# Patient Record
Sex: Male | Born: 1970
Health system: Southern US, Community
[De-identification: ages and names within clinical notes are randomized; demographics above are authoritative.]

## PROBLEM LIST (undated history)

## (undated) DIAGNOSIS — K604 Rectal fistula, unspecified: Secondary | ICD-10-CM

## (undated) DIAGNOSIS — G709 Myoneural disorder, unspecified: Secondary | ICD-10-CM

## (undated) DIAGNOSIS — E785 Hyperlipidemia, unspecified: Secondary | ICD-10-CM

## (undated) DIAGNOSIS — K621 Rectal polyp: Secondary | ICD-10-CM

## (undated) DIAGNOSIS — Z789 Other specified health status: Secondary | ICD-10-CM

## (undated) DIAGNOSIS — I10 Essential (primary) hypertension: Secondary | ICD-10-CM

## (undated) DIAGNOSIS — E119 Type 2 diabetes mellitus without complications: Secondary | ICD-10-CM

## (undated) HISTORY — DX: Rectal polyp: K62.1

## (undated) HISTORY — DX: Rectal fistula, unspecified: K60.40

## (undated) HISTORY — PX: HAND SURGERY: SHX662

## (undated) HISTORY — PX: WISDOM TOOTH EXTRACTION: SHX21

## (undated) HISTORY — DX: Rectal fistula: K60.4

## (undated) HISTORY — DX: Myoneural disorder, unspecified: G70.9

## (undated) HISTORY — DX: Essential (primary) hypertension: I10

## (undated) HISTORY — DX: Hyperlipidemia, unspecified: E78.5

---

## 2001-12-30 ENCOUNTER — Emergency Department (HOSPITAL_COMMUNITY): Admission: EM | Admit: 2001-12-30 | Discharge: 2001-12-30 | Payer: Self-pay | Admitting: Emergency Medicine

## 2010-01-12 ENCOUNTER — Emergency Department (HOSPITAL_COMMUNITY): Admission: EM | Admit: 2010-01-12 | Discharge: 2010-01-12 | Payer: Self-pay | Admitting: Emergency Medicine

## 2015-07-01 ENCOUNTER — Other Ambulatory Visit: Payer: Self-pay | Admitting: Orthopedic Surgery

## 2015-07-03 ENCOUNTER — Ambulatory Visit (HOSPITAL_COMMUNITY)
Admission: RE | Admit: 2015-07-03 | Discharge: 2015-07-03 | Disposition: A | Discharge status: Home / Self Care | Payer: Worker's Compensation | Source: Ambulatory Visit | Attending: Orthopedic Surgery | Admitting: Orthopedic Surgery

## 2015-07-03 ENCOUNTER — Encounter (HOSPITAL_COMMUNITY): Payer: Self-pay

## 2015-07-03 ENCOUNTER — Encounter (HOSPITAL_COMMUNITY)
Admission: RE | Admit: 2015-07-03 | Discharge: 2015-07-03 | Disposition: A | Discharge status: Home / Self Care | Payer: Worker's Compensation | Source: Ambulatory Visit | Attending: Orthopedic Surgery | Admitting: Orthopedic Surgery

## 2015-07-03 DIAGNOSIS — Z01812 Encounter for preprocedural laboratory examination: Secondary | ICD-10-CM | POA: Insufficient documentation

## 2015-07-03 DIAGNOSIS — Z01818 Encounter for other preprocedural examination: Secondary | ICD-10-CM | POA: Diagnosis not present

## 2015-07-03 DIAGNOSIS — M5410 Radiculopathy, site unspecified: Secondary | ICD-10-CM | POA: Diagnosis not present

## 2015-07-03 HISTORY — DX: Other specified health status: Z78.9

## 2015-07-03 LAB — COMPREHENSIVE METABOLIC PANEL
ALT: 85 U/L — ABNORMAL HIGH (ref 17–63)
AST: 46 U/L — ABNORMAL HIGH (ref 15–41)
Albumin: 4.2 g/dL (ref 3.5–5.0)
Alkaline Phosphatase: 83 U/L (ref 38–126)
Anion gap: 11 (ref 5–15)
BUN: 11 mg/dL (ref 6–20)
CO2: 24 mmol/L (ref 22–32)
Calcium: 9.8 mg/dL (ref 8.9–10.3)
Chloride: 104 mmol/L (ref 101–111)
Creatinine, Ser: 0.93 mg/dL (ref 0.61–1.24)
GFR calc Af Amer: >60 mL/min (ref >60)
GFR calc non Af Amer: >60 mL/min (ref >60)
Glucose, Bld: 167 mg/dL — ABNORMAL HIGH (ref 65–99)
Potassium: 4.3 mmol/L (ref 3.5–5.1)
Sodium: 139 mmol/L (ref 135–145)
Total Bilirubin: 0.9 mg/dL (ref 0.3–1.2)
Total Protein: 7.2 g/dL (ref 6.5–8.1)

## 2015-07-03 LAB — SURGICAL PCR SCREEN
MRSA, PCR: NEGATIVE
Staphylococcus aureus: POSITIVE — AB

## 2015-07-03 LAB — CBC WITH DIFFERENTIAL/PLATELET
Basophils Absolute: 0 10*3/uL (ref 0.0–0.1)
Basophils Relative: 0 %
Eosinophils Absolute: 0.1 10*3/uL (ref 0.0–0.7)
Eosinophils Relative: 1 %
HCT: 44.6 % (ref 39.0–52.0)
Hemoglobin: 15.2 g/dL (ref 13.0–17.0)
Lymphocytes Relative: 25 %
Lymphs Abs: 2 10*3/uL (ref 0.7–4.0)
MCH: 28.7 pg (ref 26.0–34.0)
MCHC: 34.1 g/dL (ref 30.0–36.0)
MCV: 84.2 fL (ref 78.0–100.0)
Monocytes Absolute: 0.5 10*3/uL (ref 0.1–1.0)
Monocytes Relative: 6 %
Neutro Abs: 5.5 10*3/uL (ref 1.7–7.7)
Neutrophils Relative %: 68 %
Platelets: 301 10*3/uL (ref 150–400)
RBC: 5.3 MIL/uL (ref 4.22–5.81)
RDW: 13.3 % (ref 11.5–15.5)
WBC: 8.1 10*3/uL (ref 4.0–10.5)

## 2015-07-03 LAB — APTT: aPTT: 26 seconds (ref 24–37)

## 2015-07-03 LAB — URINALYSIS, ROUTINE W REFLEX MICROSCOPIC
Bilirubin Urine: NEGATIVE
Glucose, UA: NEGATIVE mg/dL
Hgb urine dipstick: NEGATIVE
Ketones, ur: NEGATIVE mg/dL
Leukocytes, UA: NEGATIVE
Nitrite: NEGATIVE
Protein, ur: 30 mg/dL — AB
Specific Gravity, Urine: 1.021 (ref 1.005–1.030)
pH: 5.5 (ref 5.0–8.0)

## 2015-07-03 LAB — URINE MICROSCOPIC-ADD ON
Bacteria, UA: NONE SEEN
RBC / HPF: NONE SEEN RBC/hpf (ref 0–5)
Squamous Epithelial / LPF: NONE SEEN
WBC, UA: NONE SEEN WBC/hpf (ref 0–5)

## 2015-07-03 LAB — PROTIME-INR
INR: 0.99 (ref 0.00–1.49)
Prothrombin Time: 13.3 seconds (ref 11.6–15.2)

## 2015-07-03 NOTE — Progress Notes (Signed)
PCP - patient denies having a PCP and states that if he needed to see a doctor he would go to Urgent Care in [place] - denies  EKG- 07/03/15 CXR - 07/03/15  Echo/stress test/cardiac cath - denies  Patient denies chest pain and shortness of breath at PAT appointment.

## 2015-07-03 NOTE — Pre-Procedure Instructions (Signed)
    TIMBERLAND ZEHRUNG  07/03/2015      [id number] - [place], [place] [address] PhoneSE:[phone] FaxXO:[phone]    Your procedure is scheduled on Wednesday, February 22nd, 2017.  Report to [facility] Admitting at 9:30 A.M.  Call this number if you have problems the morning of surgery:  [phone]   Remember:  Do not eat food or drink liquids after midnight.   Take these medicines the morning of surgery with A SIP OF WATER: Acetaminophen-Codeine (Tylenol #3).  Stop taking: Meloxicam (Mobic), Aspirin, NSAIDS, Aleve, Naproxen, Ibuprofen, Advil, Motrin, BC's, Goody's, fish oil, all herbal medications, and all vitamins.    Do not wear jewelry.  Do not wear lotions, powders, or colognes.    Men may shave face and neck.  Do not bring valuables to the hospital.  [facility] is not responsible for any belongings or valuables.  Contacts, dentures or bridgework may not be worn into surgery.  Leave your suitcase in the car.  After surgery it may be brought to your room.  For patients admitted to the hospital, discharge time will be determined by your treatment team.  Patients discharged the day of surgery will not be allowed to drive home.   Special instructions:  See attached.   Please read over the following fact sheets that you were given. Pain Booklet, Coughing and Deep Breathing, MRSA Information and Surgical Site Infection Prevention

## 2015-07-03 NOTE — Progress Notes (Signed)
Left message for patient informing him of PCR results.  Prescription called to [facility].

## 2015-07-05 NOTE — H&P (Signed)
     PREOPERATIVE H&P  Chief Complaint: R leg pain  HPI: Jason Waters is a 45 y.o. male who presents with ongoing pain in the R leg  MRI reveals a broad based L5/S1 HNP, compressing the R S1 nerve  Patient has failed multiple forms of conservative care and continues to have pain (see office notes for additional details regarding the patient's full course of treatment)  Past Medical History  Diagnosis Date  . Medical history non-contributory    Past Surgical History  Procedure Laterality Date  . Hand surgery Left   . Wisdom tooth extraction     Social History   Social History  . Marital Status: Divorced    Spouse Name: N/A  . Number of Children: N/A  . Years of Education: N/A   Social History Main Topics  . Smoking status: Never Smoker   . Smokeless tobacco: Never Used  . Alcohol Use: Yes     Comment: rarely  . Drug Use: No  . Sexual Activity: Not on file   Other Topics Concern  . Not on file   Social History Narrative  . No narrative on file   No family history on file. No Known Allergies Prior to Admission medications   Medication Sig Start Date End Date Taking? Authorizing Provider  acetaminophen-codeine (TYLENOL #3) 300-30 MG tablet Take 1-2 tablets by mouth every 4 (four) hours as needed for moderate pain.   Yes Historical Provider, MD  meloxicam (MOBIC) 15 MG tablet Take 15 mg by mouth daily.   Yes Historical Provider, MD     All other systems have been reviewed and were otherwise negative with the exception of those mentioned in the HPI and as above.  Physical Exam: There were no vitals filed for this visit.  General: Alert, no acute distress Cardiovascular: No pedal edema Respiratory: No cyanosis, no use of accessory musculature Skin: No lesions in the area of chief complaint Neurologic: Sensation intact distally Psychiatric: Patient is competent for consent with normal mood and affect Lymphatic: No axillary or cervical  lymphadenopathy  MUSCULOSKELETAL: + SLR on the right  Assessment/Plan: Radiculopathy Plan for Procedure(s): RIGHT SIDED LUMBAR 5-SACRUM 1 MICRODISECTOMY   Sinclair Ship, MD 07/05/2015 8:35 AM

## 2015-07-08 MED ORDER — DEXTROSE 5 % IV SOLN
3.0000 g | INTRAVENOUS | Status: AC
  Administered 2015-07-09: 3 g via INTRAVENOUS
  Filled 2015-07-08: qty 3000

## 2015-07-09 ENCOUNTER — Ambulatory Visit (HOSPITAL_COMMUNITY): Payer: Worker's Compensation | Admitting: Certified Registered"

## 2015-07-09 ENCOUNTER — Ambulatory Visit (HOSPITAL_COMMUNITY): Payer: Worker's Compensation

## 2015-07-09 ENCOUNTER — Ambulatory Visit (HOSPITAL_COMMUNITY)
Admission: RE | Admit: 2015-07-09 | Discharge: 2015-07-09 | Disposition: A | Discharge status: Home / Self Care | Payer: Worker's Compensation | Source: Ambulatory Visit | Attending: Orthopedic Surgery | Admitting: Orthopedic Surgery

## 2015-07-09 ENCOUNTER — Encounter (HOSPITAL_COMMUNITY)
Admission: RE | Disposition: A | Discharge status: Home / Self Care | Payer: Self-pay | Source: Ambulatory Visit | Attending: Orthopedic Surgery

## 2015-07-09 ENCOUNTER — Encounter (HOSPITAL_COMMUNITY): Payer: Self-pay | Admitting: *Deleted

## 2015-07-09 DIAGNOSIS — Z791 Long term (current) use of non-steroidal anti-inflammatories (NSAID): Secondary | ICD-10-CM | POA: Insufficient documentation

## 2015-07-09 DIAGNOSIS — Z6841 Body Mass Index (BMI) 40.0 and over, adult: Secondary | ICD-10-CM | POA: Diagnosis not present

## 2015-07-09 DIAGNOSIS — M5117 Intervertebral disc disorders with radiculopathy, lumbosacral region: Secondary | ICD-10-CM | POA: Insufficient documentation

## 2015-07-09 DIAGNOSIS — M5418 Radiculopathy, sacral and sacrococcygeal region: Secondary | ICD-10-CM | POA: Diagnosis present

## 2015-07-09 DIAGNOSIS — Z419 Encounter for procedure for purposes other than remedying health state, unspecified: Secondary | ICD-10-CM

## 2015-07-09 HISTORY — PX: LUMBAR LAMINECTOMY/DECOMPRESSION MICRODISCECTOMY: SHX5026

## 2015-07-09 SURGERY — LUMBAR LAMINECTOMY/DECOMPRESSION MICRODISCECTOMY
Anesthesia: General | Site: Spine Lumbar | Laterality: Right

## 2015-07-09 MED ORDER — ROCURONIUM BROMIDE 100 MG/10ML IV SOLN
INTRAVENOUS | Status: DC | PRN
  Administered 2015-07-09 (×3): 10 mg via INTRAVENOUS
  Administered 2015-07-09: 50 mg via INTRAVENOUS
  Administered 2015-07-09 (×2): 20 mg via INTRAVENOUS

## 2015-07-09 MED ORDER — METHYLPREDNISOLONE ACETATE 40 MG/ML IJ SUSP
INTRAMUSCULAR | Status: AC
  Filled 2015-07-09: qty 1

## 2015-07-09 MED ORDER — PROPOFOL 10 MG/ML IV BOLUS
INTRAVENOUS | Status: AC
  Filled 2015-07-09: qty 20

## 2015-07-09 MED ORDER — BUPIVACAINE-EPINEPHRINE 0.25% -1:200000 IJ SOLN
INTRAMUSCULAR | Status: DC | PRN
  Administered 2015-07-09: 6 mL

## 2015-07-09 MED ORDER — SUFENTANIL CITRATE 50 MCG/ML IV SOLN
INTRAVENOUS | Status: DC | PRN
  Administered 2015-07-09 (×2): 10 ug via INTRAVENOUS
  Administered 2015-07-09: 30 ug via INTRAVENOUS
  Administered 2015-07-09: 10 ug via INTRAVENOUS

## 2015-07-09 MED ORDER — METHYLENE BLUE 0.5 % INJ SOLN
INTRAVENOUS | Status: DC | PRN
Start: 2015-07-09 — End: 2015-07-09
  Administered 2015-07-09: .5 mL via INTRADERMAL

## 2015-07-09 MED ORDER — METHYLPREDNISOLONE ACETATE 40 MG/ML IJ SUSP
INTRAMUSCULAR | Status: DC | PRN
  Administered 2015-07-09: 40 mg via INTRALESIONAL

## 2015-07-09 MED ORDER — ONDANSETRON HCL 4 MG/2ML IJ SOLN
INTRAMUSCULAR | Status: DC | PRN
Start: 2015-07-09 — End: 2015-07-09
  Administered 2015-07-09: 4 mg via INTRAVENOUS

## 2015-07-09 MED ORDER — ROCURONIUM BROMIDE 50 MG/5ML IV SOLN
INTRAVENOUS | Status: AC
  Filled 2015-07-09: qty 2

## 2015-07-09 MED ORDER — PROPOFOL 10 MG/ML IV BOLUS
INTRAVENOUS | Status: DC | PRN
  Administered 2015-07-09: 200 mg via INTRAVENOUS

## 2015-07-09 MED ORDER — LIDOCAINE HCL (CARDIAC) 20 MG/ML IV SOLN
INTRAVENOUS | Status: AC
  Filled 2015-07-09: qty 5

## 2015-07-09 MED ORDER — SUFENTANIL CITRATE 50 MCG/ML IV SOLN
INTRAVENOUS | Status: AC
  Filled 2015-07-09: qty 1

## 2015-07-09 MED ORDER — THROMBIN 20000 UNITS EX SOLR
CUTANEOUS | Status: DC | PRN
  Administered 2015-07-09: 20 mL via TOPICAL

## 2015-07-09 MED ORDER — HEMOSTATIC AGENTS (NO CHARGE) OPTIME
TOPICAL | Status: DC | PRN
  Administered 2015-07-09: 1 via TOPICAL

## 2015-07-09 MED ORDER — SUCCINYLCHOLINE CHLORIDE 20 MG/ML IJ SOLN
INTRAMUSCULAR | Status: AC
  Filled 2015-07-09: qty 2

## 2015-07-09 MED ORDER — LIDOCAINE HCL (CARDIAC) 20 MG/ML IV SOLN
INTRAVENOUS | Status: DC | PRN
  Administered 2015-07-09: 30 mg via INTRAVENOUS

## 2015-07-09 MED ORDER — SUGAMMADEX SODIUM 200 MG/2ML IV SOLN
INTRAVENOUS | Status: DC | PRN
  Administered 2015-07-09: 200 mg via INTRAVENOUS

## 2015-07-09 MED ORDER — MIDAZOLAM HCL 5 MG/5ML IJ SOLN
INTRAMUSCULAR | Status: DC | PRN
  Administered 2015-07-09: 2 mg via INTRAVENOUS

## 2015-07-09 MED ORDER — OXYCODONE HCL 5 MG PO TABS: 5.0000 mg | ORAL_TABLET | Freq: Once | ORAL | Status: DC

## 2015-07-09 MED ORDER — SODIUM CHLORIDE 0.9 % IJ SOLN
INTRAMUSCULAR | Status: AC
  Filled 2015-07-09: qty 10

## 2015-07-09 MED ORDER — MIDAZOLAM HCL 2 MG/2ML IJ SOLN: 0.5000 mg | Freq: Once | INTRAMUSCULAR | Status: DC | PRN

## 2015-07-09 MED ORDER — POVIDONE-IODINE 7.5 % EX SOLN
Freq: Once | CUTANEOUS | Status: DC
  Filled 2015-07-09: qty 118

## 2015-07-09 MED ORDER — ROCURONIUM BROMIDE 50 MG/5ML IV SOLN
INTRAVENOUS | Status: AC
  Filled 2015-07-09: qty 1

## 2015-07-09 MED ORDER — BUPIVACAINE-EPINEPHRINE (PF) 0.25% -1:200000 IJ SOLN
INTRAMUSCULAR | Status: AC
  Filled 2015-07-09: qty 30

## 2015-07-09 MED ORDER — PROMETHAZINE HCL 25 MG/ML IJ SOLN: 6.2500 mg | INTRAMUSCULAR | Status: DC | PRN

## 2015-07-09 MED ORDER — HYDROMORPHONE HCL 1 MG/ML IJ SOLN
0.2500 mg | INTRAMUSCULAR | Status: DC | PRN
  Administered 2015-07-09: 0.5 mg via INTRAVENOUS

## 2015-07-09 MED ORDER — LACTATED RINGERS IV SOLN
INTRAVENOUS | Status: DC
  Administered 2015-07-09 (×2): via INTRAVENOUS

## 2015-07-09 MED ORDER — PHENYLEPHRINE HCL 10 MG/ML IJ SOLN
10.0000 mg | INTRAVENOUS | Status: DC | PRN
  Administered 2015-07-09: 40 ug/min via INTRAVENOUS

## 2015-07-09 MED ORDER — SUGAMMADEX SODIUM 200 MG/2ML IV SOLN
INTRAVENOUS | Status: AC
  Filled 2015-07-09: qty 2

## 2015-07-09 MED ORDER — HYDROMORPHONE HCL 1 MG/ML IJ SOLN
INTRAMUSCULAR | Status: DC
Start: 2015-07-09 — End: 2015-07-09
  Filled 2015-07-09: qty 1

## 2015-07-09 MED ORDER — 0.9 % SODIUM CHLORIDE (POUR BTL) OPTIME
TOPICAL | Status: DC | PRN
  Administered 2015-07-09: 1000 mL

## 2015-07-09 MED ORDER — MEPERIDINE HCL 25 MG/ML IJ SOLN: 6.2500 mg | INTRAMUSCULAR | Status: DC | PRN

## 2015-07-09 MED ORDER — PHENYLEPHRINE HCL 10 MG/ML IJ SOLN
INTRAMUSCULAR | Status: DC | PRN
  Administered 2015-07-09 (×2): 80 ug via INTRAVENOUS

## 2015-07-09 MED ORDER — ONDANSETRON HCL 4 MG/2ML IJ SOLN
INTRAMUSCULAR | Status: AC
  Filled 2015-07-09: qty 2

## 2015-07-09 MED ORDER — THROMBIN 20000 UNITS EX SOLR
CUTANEOUS | Status: AC
  Filled 2015-07-09: qty 20000

## 2015-07-09 MED ORDER — MIDAZOLAM HCL 2 MG/2ML IJ SOLN
INTRAMUSCULAR | Status: AC
  Filled 2015-07-09: qty 2

## 2015-07-09 SURGICAL SUPPLY — 68 items
BENZOIN TINCTURE PRP APPL 2/3 (GAUZE/BANDAGES/DRESSINGS) ×2 IMPLANT
BUR ROUND PRECISION 4.0 (BURR) ×2 IMPLANT
CANISTER SUCTION 2500CC (MISCELLANEOUS) ×2 IMPLANT
CARTRIDGE OIL MAESTRO DRILL (MISCELLANEOUS) ×1 IMPLANT
CORDS BIPOLAR (ELECTRODE) ×2 IMPLANT
COVER SURGICAL LIGHT HANDLE (MISCELLANEOUS) ×2 IMPLANT
DIFFUSER DRILL AIR PNEUMATIC (MISCELLANEOUS) ×2 IMPLANT
DRAIN CHANNEL 15F RND FF W/TCR (WOUND CARE) IMPLANT
DRAPE POUCH INSTRU U-SHP 10X18 (DRAPES) ×2 IMPLANT
DRAPE SURG 17X23 STRL (DRAPES) ×8 IMPLANT
DURAPREP 26ML APPLICATOR (WOUND CARE) ×2 IMPLANT
ELECT BLADE 4.0 EZ CLEAN MEGAD (MISCELLANEOUS) ×2
ELECT CAUTERY BLADE 6.4 (BLADE) ×2 IMPLANT
ELECT REM PT RETURN 9FT ADLT (ELECTROSURGICAL) ×2
ELECTRODE BLDE 4.0 EZ CLN MEGD (MISCELLANEOUS) ×1 IMPLANT
ELECTRODE REM PT RTRN 9FT ADLT (ELECTROSURGICAL) ×1 IMPLANT
EVACUATOR SILICONE 100CC (DRAIN) IMPLANT
FILTER STRAW FLUID ASPIR (MISCELLANEOUS) ×2 IMPLANT
GAUZE SPONGE 4X4 12PLY STRL (GAUZE/BANDAGES/DRESSINGS) ×2 IMPLANT
GAUZE SPONGE 4X4 16PLY XRAY LF (GAUZE/BANDAGES/DRESSINGS) ×2 IMPLANT
GLOVE BIO SURGEON STRL SZ7 (GLOVE) ×4 IMPLANT
GLOVE BIO SURGEON STRL SZ8 (GLOVE) ×2 IMPLANT
GLOVE BIOGEL PI IND STRL 6.5 (GLOVE) ×1 IMPLANT
GLOVE BIOGEL PI IND STRL 7.0 (GLOVE) ×1 IMPLANT
GLOVE BIOGEL PI IND STRL 8 (GLOVE) ×1 IMPLANT
GLOVE BIOGEL PI INDICATOR 6.5 (GLOVE) ×1
GLOVE BIOGEL PI INDICATOR 7.0 (GLOVE) ×1
GLOVE BIOGEL PI INDICATOR 8 (GLOVE) ×1
GLOVE SURG SS PI 6.5 STRL IVOR (GLOVE) ×2 IMPLANT
GOWN STRL REUS W/ TWL LRG LVL3 (GOWN DISPOSABLE) ×2 IMPLANT
GOWN STRL REUS W/ TWL XL LVL3 (GOWN DISPOSABLE) ×2 IMPLANT
GOWN STRL REUS W/TWL LRG LVL3 (GOWN DISPOSABLE) ×2
GOWN STRL REUS W/TWL XL LVL3 (GOWN DISPOSABLE) ×2
IV CATH 14GX2 1/4 (CATHETERS) ×2 IMPLANT
KIT BASIN OR (CUSTOM PROCEDURE TRAY) ×2 IMPLANT
KIT POSITION SURG JACKSON T1 (MISCELLANEOUS) ×2 IMPLANT
KIT ROOM TURNOVER OR (KITS) ×2 IMPLANT
NEEDLE 18GX1X1/2 (RX/OR ONLY) (NEEDLE) ×4 IMPLANT
NEEDLE 22X1 1/2 (OR ONLY) (NEEDLE) ×2 IMPLANT
NEEDLE HYPO 25GX1X1/2 BEV (NEEDLE) ×2 IMPLANT
NEEDLE SPNL 18GX3.5 QUINCKE PK (NEEDLE) ×4 IMPLANT
NS IRRIG 1000ML POUR BTL (IV SOLUTION) ×2 IMPLANT
OIL CARTRIDGE MAESTRO DRILL (MISCELLANEOUS) ×2
PACK LAMINECTOMY ORTHO (CUSTOM PROCEDURE TRAY) ×2 IMPLANT
PACK UNIVERSAL I (CUSTOM PROCEDURE TRAY) ×2 IMPLANT
PAD ARMBOARD 7.5X6 YLW CONV (MISCELLANEOUS) ×4 IMPLANT
PATTIES SURGICAL .5 X.5 (GAUZE/BANDAGES/DRESSINGS) IMPLANT
PATTIES SURGICAL .5 X1 (DISPOSABLE) ×2 IMPLANT
SPONGE INTESTINAL PEANUT (DISPOSABLE) ×2 IMPLANT
SPONGE SURGIFOAM ABS GEL 100 (HEMOSTASIS) ×2 IMPLANT
SPONGE SURGIFOAM ABS GEL SZ50 (HEMOSTASIS) IMPLANT
STRIP CLOSURE SKIN 1/2X4 (GAUZE/BANDAGES/DRESSINGS) ×2 IMPLANT
SURGIFLO W/THROMBIN 8M KIT (HEMOSTASIS) ×2 IMPLANT
SUT MNCRL AB 4-0 PS2 18 (SUTURE) ×2 IMPLANT
SUT VIC AB 0 CT1 18XCR BRD 8 (SUTURE) ×1 IMPLANT
SUT VIC AB 0 CT1 27 (SUTURE)
SUT VIC AB 0 CT1 27XBRD ANBCTR (SUTURE) IMPLANT
SUT VIC AB 0 CT1 8-18 (SUTURE) ×1
SUT VIC AB 1 CT1 18XCR BRD 8 (SUTURE) ×1 IMPLANT
SUT VIC AB 1 CT1 8-18 (SUTURE) ×1
SUT VIC AB 2-0 CT2 18 VCP726D (SUTURE) ×2 IMPLANT
SYR 20CC LL (SYRINGE) ×2 IMPLANT
SYR BULB IRRIGATION 50ML (SYRINGE) ×2 IMPLANT
SYR CONTROL 10ML LL (SYRINGE) ×4 IMPLANT
SYR TB 1ML LUER SLIP (SYRINGE) ×4 IMPLANT
TOWEL OR 17X24 6PK STRL BLUE (TOWEL DISPOSABLE) ×2 IMPLANT
TOWEL OR 17X26 10 PK STRL BLUE (TOWEL DISPOSABLE) ×2 IMPLANT
YANKAUER SUCT BULB TIP NO VENT (SUCTIONS) ×2 IMPLANT

## 2015-07-09 NOTE — Anesthesia Preprocedure Evaluation (Addendum)
Anesthesia Evaluation  Patient identified by MRN, date of birth, ID band Patient awake    Reviewed: Allergy & Precautions, NPO status , Patient's Chart, lab work & pertinent test results  History of Anesthesia Complications Negative for: history of anesthetic complications  Airway Mallampati: II  TM Distance: >3 FB Neck ROM: Full    Dental  (+) Dental Advisory Given   Pulmonary neg pulmonary ROS,    breath sounds clear to auscultation       Cardiovascular negative cardio ROS   Rhythm:Regular Rate:Normal     Neuro/Psych Chronic back pain: narcotics daily    GI/Hepatic negative GI ROS, Slight elevation LFTs: pt reports frequent Tylenol use over past month in prep for back surgery   Endo/Other  Morbid obesity  Renal/GU negative Renal ROS     Musculoskeletal   Abdominal (+) + obese,   Peds  Hematology negative hematology ROS (+)   Anesthesia Other Findings   Reproductive/Obstetrics                             Anesthesia Physical Anesthesia Plan  ASA: II  Anesthesia Plan: General   Post-op Pain Management:    Induction: Intravenous  Airway Management Planned: Oral ETT  Additional Equipment:   Intra-op Plan:   Post-operative Plan: Extubation in OR  Informed Consent: I have reviewed the patients History and Physical, chart, labs and discussed the procedure including the risks, benefits and alternatives for the proposed anesthesia with the patient or authorized representative who has indicated his/her understanding and acceptance.   Dental advisory given  Plan Discussed with: CRNA and Surgeon  Anesthesia Plan Comments: (Plan routine monitors, GETA)        Anesthesia Quick Evaluation

## 2015-07-09 NOTE — Transfer of Care (Signed)
Immediate Anesthesia Transfer of Care Note  Patient: Jason Waters  Procedure(s) Performed: Procedure(s) with comments: RIGHT SIDED LUMBAR 5-SACRUM 1 MICRODISECTOMY (Right) - Right sided lumbar 5-sacrum 1 microdisectomy  Patient Location: PACU  Anesthesia Type:General  Level of Consciousness: awake, oriented and patient cooperative  Airway & Oxygen Therapy: Patient Spontanous Breathing and Patient connected to nasal cannula oxygen  Post-op Assessment: Report given to RN, Post -op Vital signs reviewed and stable and Patient moving all extremities  Post vital signs: Reviewed and stable  Last Vitals:  Filed Vitals:   07/09/15 0923  BP: 154/76  Pulse: 80  Temp: 37.1 C  Resp: 20    Complications: No apparent anesthesia complications

## 2015-07-09 NOTE — Anesthesia Procedure Notes (Signed)
Procedure Name: Intubation Date/Time: 07/09/2015 12:44 PM Performed by: Melina Copa, Belton Peplinski R Pre-anesthesia Checklist: Patient identified, Emergency Drugs available, Suction available, Patient being monitored and Timeout performed Patient Re-evaluated:Patient Re-evaluated prior to inductionOxygen Delivery Method: Circle system utilized Preoxygenation: Pre-oxygenation with 100% oxygen Intubation Type: IV induction Ventilation: Mask ventilation without difficulty Laryngoscope Size: Mac and 4 Grade View: Grade III Tube type: Oral Tube size: 8.0 mm Number of attempts: 2 Airway Equipment and Method: Stylet Placement Confirmation: ETT inserted through vocal cords under direct vision,  positive ETCO2 and breath sounds checked- equal and bilateral Secured at: 23 cm Tube secured with: Tape Dental Injury: Teeth and Oropharynx as per pre-operative assessment

## 2015-07-09 NOTE — Progress Notes (Signed)
Report given to jamie rn as caregiver 

## 2015-07-09 NOTE — Op Note (Signed)
NAME:  Jason Waters, Jason Waters NO.:  [id number]  MEDICAL RECORD NO.:  [id number]  LOCATION:  [facility]                         FACILITY:  [facility]  PHYSICIAN:  Phylliss Bob, MD      DATE OF BIRTH:  01-12-1971  DATE OF PROCEDURE:  07/09/2015                              OPERATIVE REPORT   PREOPERATIVE DIAGNOSES: 1. Right-sided S1 radiculopathy. 2. Extruded right L5-S1 disk fragment, compression of the right S1     nerve.  POSTOPERATIVE DIAGNOSES: 1. Right-sided S1 radiculopathy. 2. Extruded right L5-S1 disk fragment, compression of the right S1     nerve.  PROCEDURE:  Right-sided L5-S1 microdiskectomy.  SURGEON:  Phylliss Bob, MD  ASSISTANT:  Pricilla Holm, PA-C  ANESTHESIA:  General endotracheal anesthesia.  COMPLICATIONS:  None.  DISPOSITION:  Stable.  ESTIMATED BLOOD LOSS:  Minimal.  INDICATIONS FOR SURGERY:  Briefly, [name] is a very pleasant 45 year old male who was injured at work on March 26, 2015.  Subsequent to his injury, he did have ongoing pain in the right leg.  An MRI did reveal a small broad-based right-sided L5-S1 disk protrusion, compressed on the right S1 nerve.  Given the patient's ongoing pain, we did discuss proceeding with the surgery above.  The patient did fail appropriate conservative treatment measures, did elect to proceed with surgery.  OPERATIVE DETAILS:  On July 09, 2015, patient was brought to surgery and general endotracheal anesthesia was administered.  The patient was placed prone on a well-padded flat Jackson bed with a spinal frame. Antibiotics were given and a time-out procedure was performed.  The back was prepped and draped and a midline incision was made overlying the L5- S1 intervertebral space.  A curvilinear incision was made just to the right of the midline of the fascia.  The paraspinal musculature was bluntly swept laterally.  A self-retaining retractor was placed.  I then used a high-speed bur to remove the  medial and inferior aspect of the L5 lamina.  The ligamentum flavum was identified and removed at its lateral aspect.  A small portion of the superior aspect of S1 was removed as well.  The traversing S1 nerve was identified and noted to be under tension.  I did medially retract the nerve.  I then used a reverse- angled Epstein curette and applied downward pressure to the intervertebral space and to the S1 vertebral body just beneath it.  In doing so, an extruded L5-S1 disk fragment did readily declare itself. The fragment was removed in 2 medium-sized fragments.  Once this was completed, there was no additional compression of the right S1 nerve identified.  Bleeding was then controlled using SurgiFlo.  There was no extravasation of cerebrospinal fluid noted.  I then introduced 40 mg of Depo-Medrol about the epidural space.  The wound was then closed in layers using #1 Vicryl followed by 0 Vicryl, followed by 2-0 Vicryl, followed by 3-0 Monocryl.  All instrument counts were correct at the termination of the procedure.  Of note, Pricilla Holm was my assistant throughout the surgery from start to finish, and did aid in retraction, suctioning, and closure.     Phylliss Bob, MD     MD/MEDQ  D:  07/09/2015  T:  07/09/2015  Job:  [id number]

## 2015-07-10 ENCOUNTER — Encounter (HOSPITAL_COMMUNITY): Payer: Self-pay | Admitting: Orthopedic Surgery

## 2015-07-10 NOTE — Anesthesia Postprocedure Evaluation (Signed)
Anesthesia Post Note  Patient: Jason Waters  Procedure(s) Performed: Procedure(s) (LRB): RIGHT SIDED LUMBAR 5-SACRUM 1 MICRODISECTOMY (Right)  Patient location during evaluation: PACU Anesthesia Type: General Level of consciousness: awake and alert, oriented and patient cooperative Pain management: pain level controlled Vital Signs Assessment: post-procedure vital signs reviewed and stable Respiratory status: spontaneous breathing, nonlabored ventilation and respiratory function stable Cardiovascular status: blood pressure returned to baseline and stable Postop Assessment: no signs of nausea or vomiting Anesthetic complications: no    Last Vitals:  Filed Vitals:   07/09/15 1615 07/09/15 1637  BP: 112/61 134/81  Pulse: 70 82  Temp: 36.7 C   Resp: 17 18    Last Pain:  Filed Vitals:   07/09/15 1652  PainSc: 1                  Everard Interrante,E. Alanna Storti

## 2016-05-08 DIAGNOSIS — R509 Fever, unspecified: Secondary | ICD-10-CM | POA: Diagnosis not present

## 2016-05-08 DIAGNOSIS — J111 Influenza due to unidentified influenza virus with other respiratory manifestations: Secondary | ICD-10-CM | POA: Diagnosis not present

## 2017-01-26 ENCOUNTER — Telehealth: Payer: Self-pay | Admitting: Internal Medicine

## 2017-01-26 NOTE — Telephone Encounter (Signed)
LVM informing patient to call back and and schedule appointment.

## 2017-01-26 NOTE — Telephone Encounter (Signed)
Pt wife called asking if you will except her husband as a new patient. Please advise

## 2017-01-26 NOTE — Telephone Encounter (Signed)
fine

## 2017-02-22 ENCOUNTER — Encounter: Payer: Self-pay | Admitting: Internal Medicine

## 2017-02-22 ENCOUNTER — Ambulatory Visit (INDEPENDENT_AMBULATORY_CARE_PROVIDER_SITE_OTHER): Payer: BLUE CROSS/BLUE SHIELD | Admitting: Internal Medicine

## 2017-02-22 ENCOUNTER — Other Ambulatory Visit (INDEPENDENT_AMBULATORY_CARE_PROVIDER_SITE_OTHER): Payer: BLUE CROSS/BLUE SHIELD

## 2017-02-22 VITALS — BP 140/80 | HR 83 | Temp 98.2°F | Ht 70.0 in | Wt 311.0 lb

## 2017-02-22 DIAGNOSIS — G8929 Other chronic pain: Secondary | ICD-10-CM | POA: Diagnosis not present

## 2017-02-22 DIAGNOSIS — L989 Disorder of the skin and subcutaneous tissue, unspecified: Secondary | ICD-10-CM

## 2017-02-22 DIAGNOSIS — M25512 Pain in left shoulder: Secondary | ICD-10-CM | POA: Diagnosis not present

## 2017-02-22 DIAGNOSIS — Z0000 Encounter for general adult medical examination without abnormal findings: Secondary | ICD-10-CM

## 2017-02-22 LAB — COMPREHENSIVE METABOLIC PANEL
ALT: 28 U/L (ref 0–53)
AST: 17 U/L (ref 0–37)
Albumin: 4.3 g/dL (ref 3.5–5.2)
Alkaline Phosphatase: 97 U/L (ref 39–117)
BUN: 16 mg/dL (ref 6–23)
CO2: 28 mEq/L (ref 19–32)
Calcium: 9.6 mg/dL (ref 8.4–10.5)
Chloride: 104 mEq/L (ref 96–112)
Creatinine, Ser: 1.02 mg/dL (ref 0.40–1.50)
GFR: 83.49 mL/min (ref >60.00)
Glucose, Bld: 171 mg/dL — ABNORMAL HIGH (ref 70–99)
Potassium: 4.3 mEq/L (ref 3.5–5.1)
Sodium: 139 mEq/L (ref 135–145)
Total Bilirubin: 0.2 mg/dL (ref 0.2–1.2)
Total Protein: 6.8 g/dL (ref 6.0–8.3)

## 2017-02-22 LAB — LDL CHOLESTEROL, DIRECT: Direct LDL: 113 mg/dL

## 2017-02-22 LAB — CBC
HCT: 43.8 % (ref 39.0–52.0)
Hemoglobin: 15.1 g/dL (ref 13.0–17.0)
MCHC: 34.6 g/dL (ref 30.0–36.0)
MCV: 85.3 fl (ref 78.0–100.0)
Platelets: 304 10*3/uL (ref 150.0–400.0)
RBC: 5.14 Mil/uL (ref 4.22–5.81)
RDW: 13.4 % (ref 11.5–15.5)
WBC: 9.5 10*3/uL (ref 4.0–10.5)

## 2017-02-22 LAB — LIPID PANEL
Cholesterol: 191 mg/dL (ref 0–200)
HDL: 30.7 mg/dL — ABNORMAL LOW (ref >39.00)
Total CHOL/HDL Ratio: 6
Triglycerides: 494 mg/dL — ABNORMAL HIGH (ref 0.0–149.0)

## 2017-02-22 LAB — HEMOGLOBIN A1C: Hgb A1c MFr Bld: 6.9 % — ABNORMAL HIGH (ref 4.6–6.5)

## 2017-02-22 LAB — VITAMIN D 25 HYDROXY (VIT D DEFICIENCY, FRACTURES): VITD: 21.78 ng/mL — ABNORMAL LOW (ref 30.00–100.00)

## 2017-02-22 NOTE — Progress Notes (Signed)
   Subjective:    Patient ID: Jason Waters, male    DOB: 12-26-1970, 46 y.o.   MRN: [id number]  HPI The patient is a 46 YO man coming in as a new patient with several problems including: skin lesion on his scalp (growing in the last several months, he is concerned about it, does not use sunscreen a lot, gets sun exposure, also has several skin tags which he would like removed), and his weight (he is concerned as he had sugar in his urine at [employer] physical, weight is the highest it has ever been, he is concerned about his health), and his left shoulder (he had injured it severely in the past, now having pain in the joint with ROM limitation, he is also having some numbness in the area rarely). Denies other concerns see ROS.   PMH, [facility], social history reviewed and updated.   Review of Systems  Constitutional: Negative.   HENT: Negative.   Eyes: Negative.   Respiratory: Negative for cough, chest tightness and shortness of breath.   Cardiovascular: Negative for chest pain, palpitations and leg swelling.  Gastrointestinal: Negative for abdominal distention, abdominal pain, constipation, diarrhea, nausea and vomiting.  Musculoskeletal: Positive for arthralgias. Negative for back pain, gait problem, joint swelling, myalgias, neck pain and neck stiffness.  Skin: Positive for color change.  Neurological: Positive for numbness. Negative for dizziness, tremors, syncope, facial asymmetry, weakness and headaches.  Psychiatric/Behavioral: Negative.       Objective:   Physical Exam  Constitutional: He is oriented to person, place, and time. He appears well-developed and well-nourished.  Obese  HENT:  Head: Normocephalic and atraumatic.  Eyes: EOM are normal.  Neck: Normal range of motion.  Cardiovascular: Normal rate and regular rhythm.   No murmur heard. Pulmonary/Chest: Effort normal and breath sounds normal. No respiratory distress. He has no wheezes. He has no rales.  Abdominal: Soft. Bowel  sounds are normal. He exhibits no distension. There is no tenderness. There is no rebound.  Musculoskeletal: He exhibits no edema.  Neurological: He is alert and oriented to person, place, and time. Coordination normal.  Skin: Skin is warm and dry.  Lesion on the scalp which appears actinic keratosis, several skin tags on the left armpit  Psychiatric: He has a normal mood and affect.   Vitals:   02/22/17 1548  BP: 140/80  Pulse: 83  Temp: 98.2 F (36.8 C)  TempSrc: Oral  SpO2: 98%  Weight: (!) 311 lb (141.1 kg)  Height: 5\' 10"  (1.778 m)      Assessment & Plan:

## 2017-02-22 NOTE — Patient Instructions (Signed)
We are checking the labs today to see if there are any problems.   We will get you in with a dermatologist for the skin spots.  We will get you in with the sports medicine doctor with the shoulder pain.  It is okay to use aleve or advil for pain in the shoulder.

## 2017-02-23 ENCOUNTER — Encounter: Payer: Self-pay | Admitting: Internal Medicine

## 2017-02-23 DIAGNOSIS — M25512 Pain in left shoulder: Secondary | ICD-10-CM

## 2017-02-23 DIAGNOSIS — G8929 Other chronic pain: Secondary | ICD-10-CM | POA: Insufficient documentation

## 2017-02-23 DIAGNOSIS — L989 Disorder of the skin and subcutaneous tissue, unspecified: Secondary | ICD-10-CM | POA: Insufficient documentation

## 2017-02-23 LAB — HIV ANTIBODY (ROUTINE TESTING W REFLEX): HIV 1&2 Ab, 4th Generation: NONREACTIVE

## 2017-02-23 NOTE — Assessment & Plan Note (Signed)
Referral to sports medicine for evaluation and possible injection. Talked to him about avoiding further injury and stretching exercises.

## 2017-02-23 NOTE — Assessment & Plan Note (Signed)
Needs screening for diabetes, cholesterol. Weight is higher than his usual and he is wanting to go down on weight some. We discussed diet and exercise.

## 2017-02-23 NOTE — Assessment & Plan Note (Signed)
Referral to dermatology for scalp lesion and skin tags. Reminded about the need to use sunscreen or protective clothing at all times and especially on scalp with balding.

## 2017-02-28 ENCOUNTER — Telehealth: Payer: Self-pay | Admitting: Internal Medicine

## 2017-02-28 NOTE — Telephone Encounter (Signed)
Patient is requesting call back in regard to labs results.  States he ate breakfast and snack before labs were done.  Wants to know if this effected his results.  Did read him Dr. Charlynne Cousins response.  Patient would like call back in regard just to discuss.  Very concerned about results.

## 2017-02-28 NOTE — Telephone Encounter (Signed)
LVM informing patient that eating before getting his A1C would not affect his results as it is checking sugars over a 3 month period. Also told patient to call back to go over labs and to let us know if he wants to start the medication for diabetes and cholesterol

## 2017-03-16 ENCOUNTER — Encounter: Payer: Self-pay | Admitting: Internal Medicine

## 2017-03-28 NOTE — Telephone Encounter (Signed)
Called patient and apologized that I had never received the message that he was willing to start these medications. But Patient states that he is willing to start the medications for diabetes and cholesterol and to be sent to pharmacy on file.

## 2017-03-28 NOTE — Telephone Encounter (Signed)
Patient called asking why his medications have not been put in, he called and talked to someone stating  He would like these,  Please send in medications for these meds to his pharmacy on file  Please advise

## 2017-03-29 MED ORDER — METFORMIN HCL 500 MG PO TABS: 500.0000 mg | ORAL_TABLET | Freq: Every day | ORAL | 1 refills | Status: DC

## 2017-03-29 MED ORDER — SIMVASTATIN 20 MG PO TABS
20.0000 mg | ORAL_TABLET | Freq: Every day | ORAL | 3 refills | Status: DC
Start: 2017-03-29 — End: 2018-03-24

## 2017-03-29 NOTE — Telephone Encounter (Signed)
Sent in metformin to take 1 pill daily, also sent in simvastatin 1 pill daily.

## 2017-03-29 NOTE — Telephone Encounter (Signed)
Contacted and informed patient

## 2017-04-10 NOTE — Progress Notes (Signed)
[facility] [place], [place] [address] Phone: [phone] Subjective:    I'm seeing this patient by the request  of:  Hoyt Koch, MD   CC: Left shoulder pain  [id number]  Jason Waters is a 46 y.o. male coming in with complaint of left shoulder pain. Says he tackled a horse 2 years ago. Numbness and tingling in arm and fingers.  Onset- 2 years ago  Location- Distal clavicle Duration-patient has had this pain on and off for quite some time. Character-dull throbbing aching pain Aggravating factors-  Reliving factors-  Therapies tried-icing intermittently Severity-6 out of 10     Past Medical History:  Diagnosis Date  . Medical history non-contributory    Past Surgical History:  Procedure Laterality Date  . HAND SURGERY Left   . LUMBAR LAMINECTOMY/DECOMPRESSION MICRODISCECTOMY Right 07/09/2015   Procedure: RIGHT SIDED LUMBAR 5-SACRUM 1 MICRODISECTOMY;  Surgeon: Phylliss Bob, MD;  Location: [facility];  Service: Orthopedics;  Laterality: Right;  Right sided lumbar 5-sacrum 1 microdisectomy  . WISDOM TOOTH EXTRACTION     Social History   Socioeconomic History  . Marital status: Married    Spouse name: None  . Number of children: None  . Years of education: None  . Highest education level: None  Social Needs  . Financial resource strain: None  . Food insecurity - worry: None  . Food insecurity - inability: None  . Transportation needs - medical: None  . Transportation needs - non-medical: None  Occupational History  . None  Tobacco Use  . Smoking status: Never Smoker  . Smokeless tobacco: Never Used  Substance and Sexual Activity  . Alcohol use: Yes    Comment: rarely  . Drug use: No  . Sexual activity: Yes    Birth control/protection: None  Other Topics Concern  . None  Social History Narrative  . None   Allergies  Allergen Reactions  . Bee Venom    Family History  Problem Relation Age of Onset    . Diabetes Father   . Heart disease Father      Past medical history, social, surgical and family history all reviewed in electronic medical record.  No pertanent information unless stated regarding to the chief complaint.   Review of Systems:Review of systems updated and as accurate as of 04/11/17  No headache, visual changes, nausea, vomiting, diarrhea, constipation, dizziness, abdominal pain, skin rash, fevers, chills, night sweats, weight loss, swollen lymph nodes, body aches, joint swelling,  chest pain, shortness of breath, mood changes.  Muscle aches  Objective  Blood pressure (!) 154/88, pulse 76, height 5\' 10"  (1.778 m), weight (!) 311 lb (141.1 kg), SpO2 98 %. Systems examined below as of 04/11/17   General: No apparent distress alert and oriented x3 mood and affect normal, dressed appropriately.  HEENT: Pupils equal, extraocular movements intact  Respiratory: Patient's speak in full sentences and does not appear short of breath  Cardiovascular: No lower extremity edema, non tender, no erythema  Skin: Warm dry intact with no signs of infection or rash on extremities or on axial skeleton.  Abdomen: Soft nontender  Neuro: Cranial nerves II through XII are intact, neurovascularly intact in all extremities with 2+ DTRs and 2+ pulses.  Lymph: No lymphadenopathy of posterior or anterior cervical chain or axillae bilaterally.  Gait normal with good balance and coordination.  MSK:  Non tender with full range of motion and good stability and symmetric strength and tone  of elbows, wrist, hip, knee and ankles bilaterally.  Shoulder: left Inspection reveals no abnormalities, atrophy or asymmetry. Palpation is normal with no tenderness over AC joint or bicipital groove. ROM is full in all planes passively. Rotator cuff strength normal throughout. signs of impingement with positive Neer and Hawkin's tests, but negative empty can sign. Speeds and Yergason's tests normal. No labral  pathology noted with negative Obrien's, negative clunk and good stability.  Positive crossover Normal scapular function observed. No painful arc and no drop arm sign. No apprehension sign  MSK US performed of: left This study was ordered, performed, and interpreted by Charlann Boxer D.O.  Shoulder:   Supraspinatus:  Appears normal on long and transverse views, Bursal bulge seen with shoulder abduction on impingement view.  Ossific changes noted Subscapularis:  Appears normal on long and transverse views. Positive bursa AC joint: Significant capsular distention with mild arthritis Glenohumeral Joint:  Appears normal without effusion. Glenoid Labrum:  Intact without visualized tears. Biceps Tendon:  Appears normal on long and transverse views, no fraying of tendon, tendon located in intertubercular groove, no subluxation with shoulder internal or external rotation.  Impression: Subacromial bursitis, calcific, distal clavicle also has some type of potential clavicle fracture, acromioclavicular arthritis  Procedure: Real-time Ultrasound Guided Injection of left acromioclavicular joint Device: GE Logiq E  Ultrasound guided injection is preferred based studies that show increased duration, increased effect, greater accuracy, decreased procedural pain, increased response rate with ultrasound guided versus blind injection.  Verbal informed consent obtained.  Time-out conducted.  Noted no overlying erythema, induration, or other signs of local infection.  Skin prepped in a sterile fashion.  Local anesthesia: Topical Ethyl chloride.  With sterile technique and under real time ultrasound guidance:  Joint visualized.  23g 1  inch needle inserted.  Approach. Pictures taken for needle placement. Patient did have injection of 1 cc of 0.5% Marcaine, and 1.0 cc of Kenalog 40 mg/dL. Completed without difficulty  Pain immediately resolved suggesting accurate placement of the medication.  Advised to call if  fevers/chills, erythema, induration, drainage, or persistent bleeding.  Images permanently stored and available for review in the ultrasound unit.  Impression: Technically successful ultrasound guided injection.     Impression and Recommendations:     This case required medical decision making of moderate complexity.      Note: This dictation was prepared with Dragon dictation along with smaller phrase technology. Any transcriptional errors that result from this process are unintentional.

## 2017-04-11 ENCOUNTER — Other Ambulatory Visit: Payer: Self-pay

## 2017-04-11 ENCOUNTER — Ambulatory Visit (INDEPENDENT_AMBULATORY_CARE_PROVIDER_SITE_OTHER)
Admission: RE | Admit: 2017-04-11 | Discharge: 2017-04-11 | Disposition: A | Discharge status: Home / Self Care | Payer: BLUE CROSS/BLUE SHIELD | Source: Ambulatory Visit | Attending: Family Medicine | Admitting: Family Medicine

## 2017-04-11 ENCOUNTER — Ambulatory Visit: Payer: Self-pay

## 2017-04-11 ENCOUNTER — Encounter: Payer: Self-pay | Admitting: Family Medicine

## 2017-04-11 ENCOUNTER — Ambulatory Visit: Payer: BLUE CROSS/BLUE SHIELD | Admitting: Family Medicine

## 2017-04-11 VITALS — BP 154/88 | HR 76 | Ht 70.0 in | Wt 311.0 lb

## 2017-04-11 DIAGNOSIS — G8929 Other chronic pain: Secondary | ICD-10-CM

## 2017-04-11 DIAGNOSIS — M25511 Pain in right shoulder: Secondary | ICD-10-CM | POA: Diagnosis not present

## 2017-04-11 DIAGNOSIS — S42035A Nondisplaced fracture of lateral end of left clavicle, initial encounter for closed fracture: Secondary | ICD-10-CM | POA: Diagnosis not present

## 2017-04-11 DIAGNOSIS — M25512 Pain in left shoulder: Principal | ICD-10-CM

## 2017-04-11 DIAGNOSIS — M7530 Calcific tendinitis of unspecified shoulder: Secondary | ICD-10-CM

## 2017-04-11 DIAGNOSIS — M25412 Effusion, left shoulder: Secondary | ICD-10-CM | POA: Insufficient documentation

## 2017-04-11 DIAGNOSIS — M542 Cervicalgia: Secondary | ICD-10-CM | POA: Diagnosis not present

## 2017-04-11 DIAGNOSIS — M19012 Primary osteoarthritis, left shoulder: Secondary | ICD-10-CM | POA: Diagnosis not present

## 2017-04-11 DIAGNOSIS — S42033A Displaced fracture of lateral end of unspecified clavicle, initial encounter for closed fracture: Secondary | ICD-10-CM | POA: Insufficient documentation

## 2017-04-11 MED ORDER — DICLOFENAC SODIUM 2 % TD SOLN: 2.0000 g | Freq: Two times a day (BID) | TRANSDERMAL | 3 refills | Status: DC

## 2017-04-11 MED ORDER — VITAMIN D (ERGOCALCIFEROL) 1.25 MG (50000 UNIT) PO CAPS: 50000.0000 [IU] | ORAL_CAPSULE | ORAL | 0 refills | Status: DC

## 2017-04-11 NOTE — Assessment & Plan Note (Signed)
Patient does have what appears to be a callus formation.  In this area this could be contributing to some of the discomfort and pain.  We discussed icing regimen, home exercise, which activities to do which wants to avoid.  Patient will slowly increase activity.  Follow-up again in 4 weeks

## 2017-04-11 NOTE — Assessment & Plan Note (Signed)
Calcific bursitis noted.  If no improvement possible injection.  Given home exercise.  Once weekly vitamin D.  Follow-up again in 4 weeks

## 2017-04-11 NOTE — Patient Instructions (Addendum)
Good to see you  Ice is your friend Ice 20 minutes 2 times daily. Usually after activity and before bed. Exercises 3 times a week.  pennsaid pinkie amount topically 2 times daily as needed.  Once weekly vitamin D for 12 weeks  Injected the [facility] joint today as well  See me again in 4 weeks if not a lot better  Happy holidays!

## 2017-04-11 NOTE — Assessment & Plan Note (Signed)
Patient given injection today.  Tolerated the procedure well.  We discussed icing regimen and home exercises.  We discussed which activities of doing which wants to avoid.  Increase activity slowly over the course the next several weeks.  Work with Product/process development scientist.  X-rays ordered for down stairs as well to rule out any other bony abnormality that could be contributing.  Topical anti-inflammatories given once weekly vitamin D for the distal clavicle fracture.  Follow-up again in 4 weeks

## 2017-05-25 DIAGNOSIS — D225 Melanocytic nevi of trunk: Secondary | ICD-10-CM | POA: Diagnosis not present

## 2017-05-25 DIAGNOSIS — D1801 Hemangioma of skin and subcutaneous tissue: Secondary | ICD-10-CM | POA: Diagnosis not present

## 2017-05-25 DIAGNOSIS — D485 Neoplasm of uncertain behavior of skin: Secondary | ICD-10-CM | POA: Diagnosis not present

## 2017-05-25 DIAGNOSIS — B079 Viral wart, unspecified: Secondary | ICD-10-CM | POA: Diagnosis not present

## 2017-09-21 ENCOUNTER — Other Ambulatory Visit: Payer: Self-pay | Admitting: Internal Medicine

## 2018-03-16 IMAGING — DX DG CERVICAL SPINE COMPLETE 4+V
6 series · 6 of 6 positions shown · non-contrast
Comparison: None.

CLINICAL DATA: Posterior neck pain for the past 2 years. Arm
numbness. No known injury. History of right clavicular fracture.

EXAM:
CERVICAL SPINE - COMPLETE 4+ VIEW

[c-spine lat]
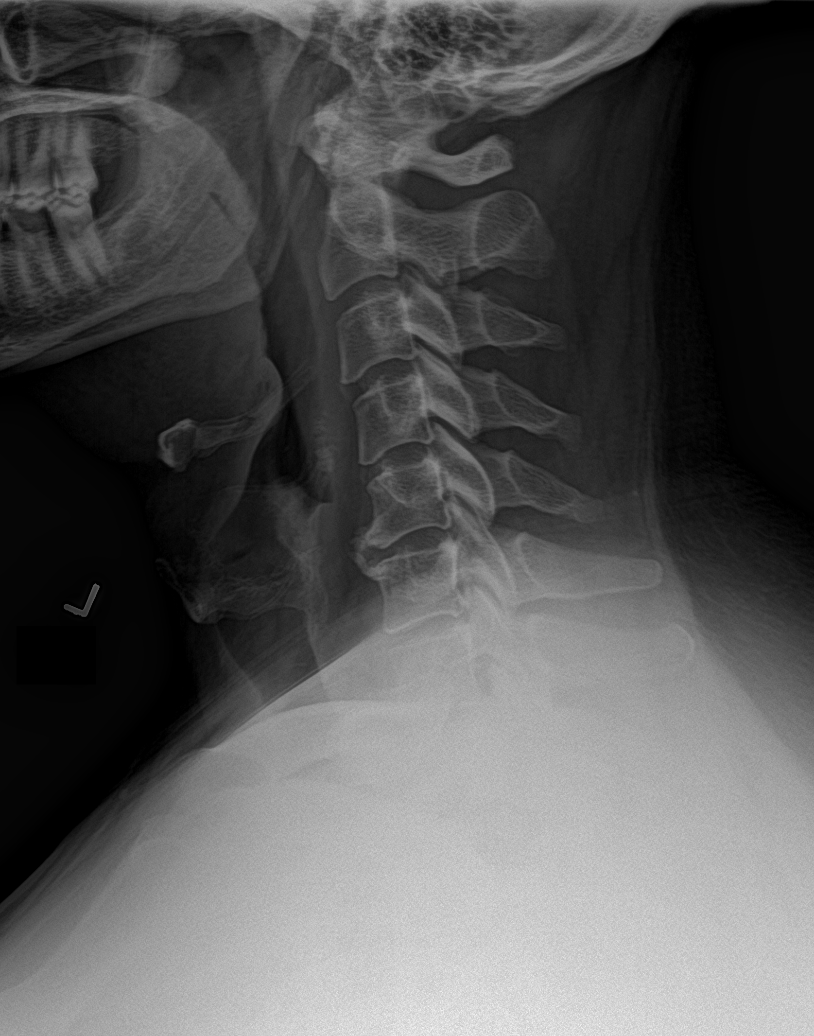

[c-spine obl (1 of 2)]
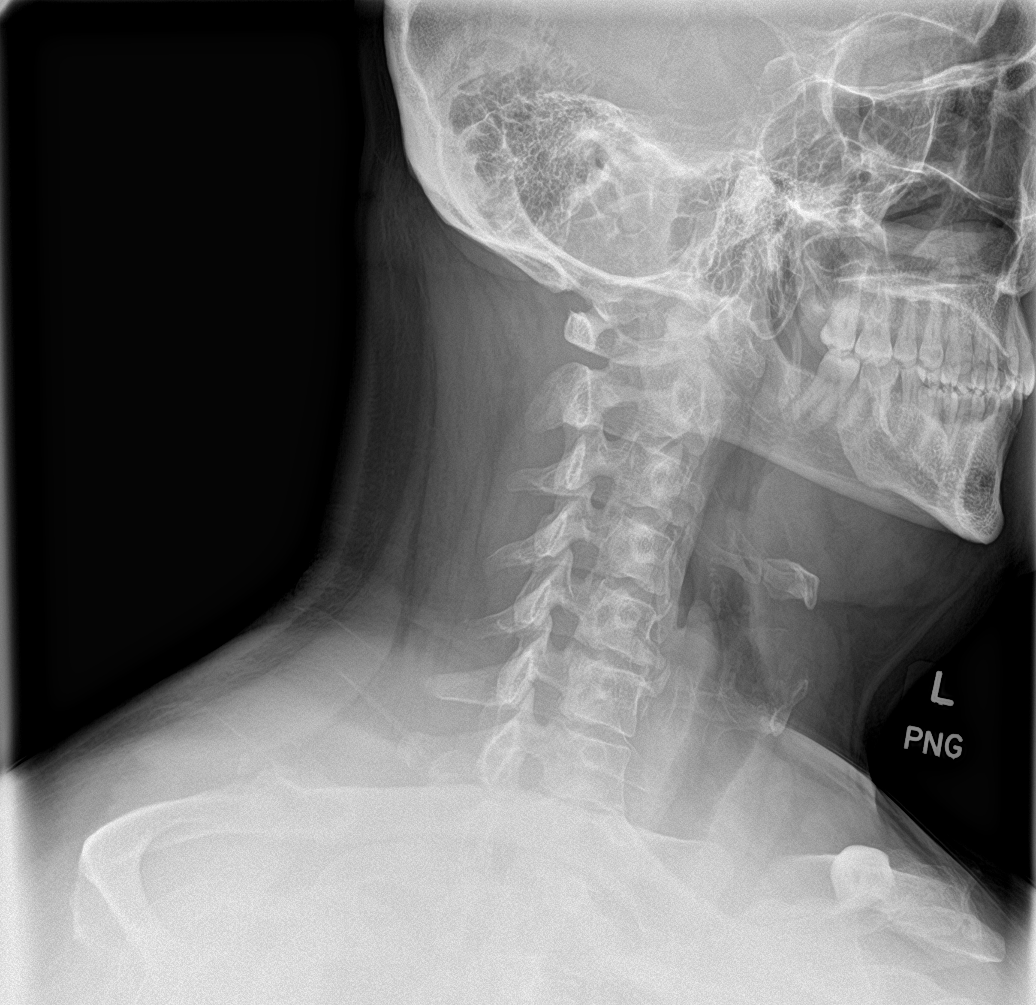

[c-spine obl (2 of 2)]
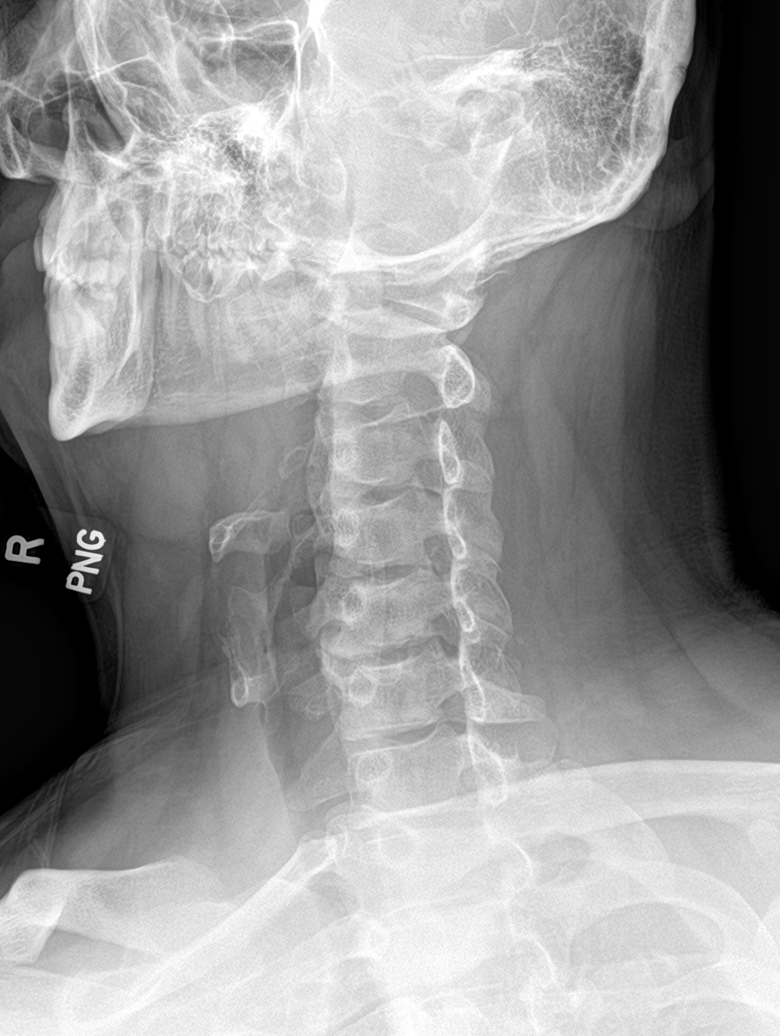

[c-spine ap]
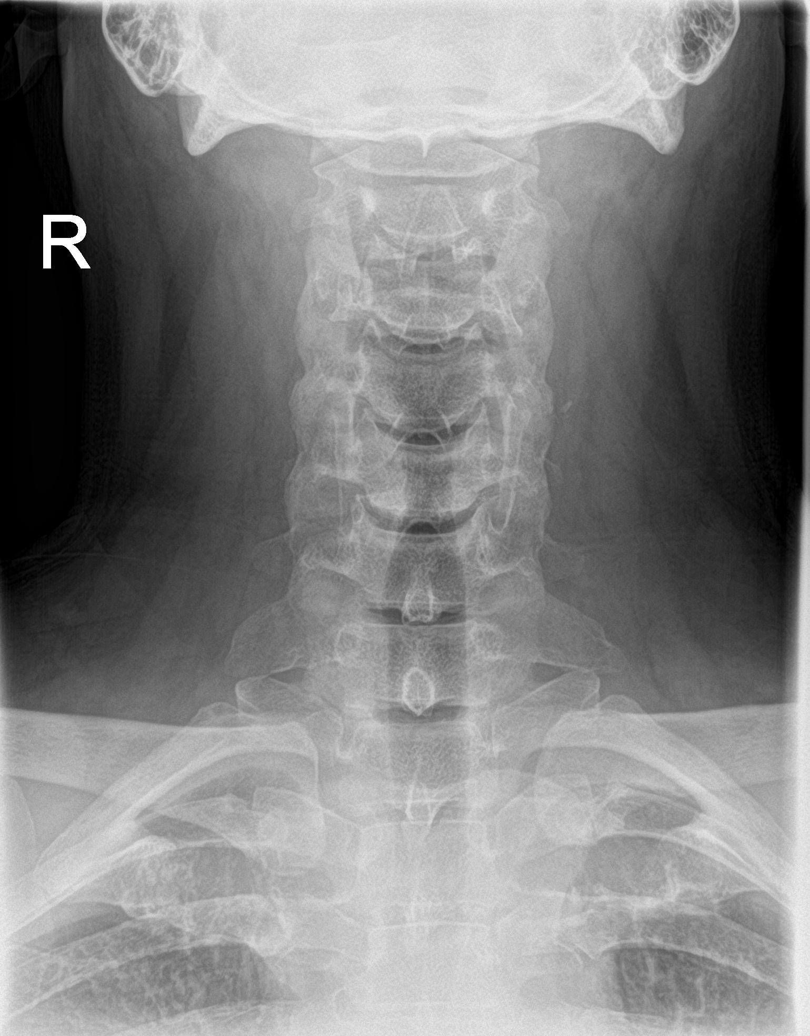

[c-spine open mouth]
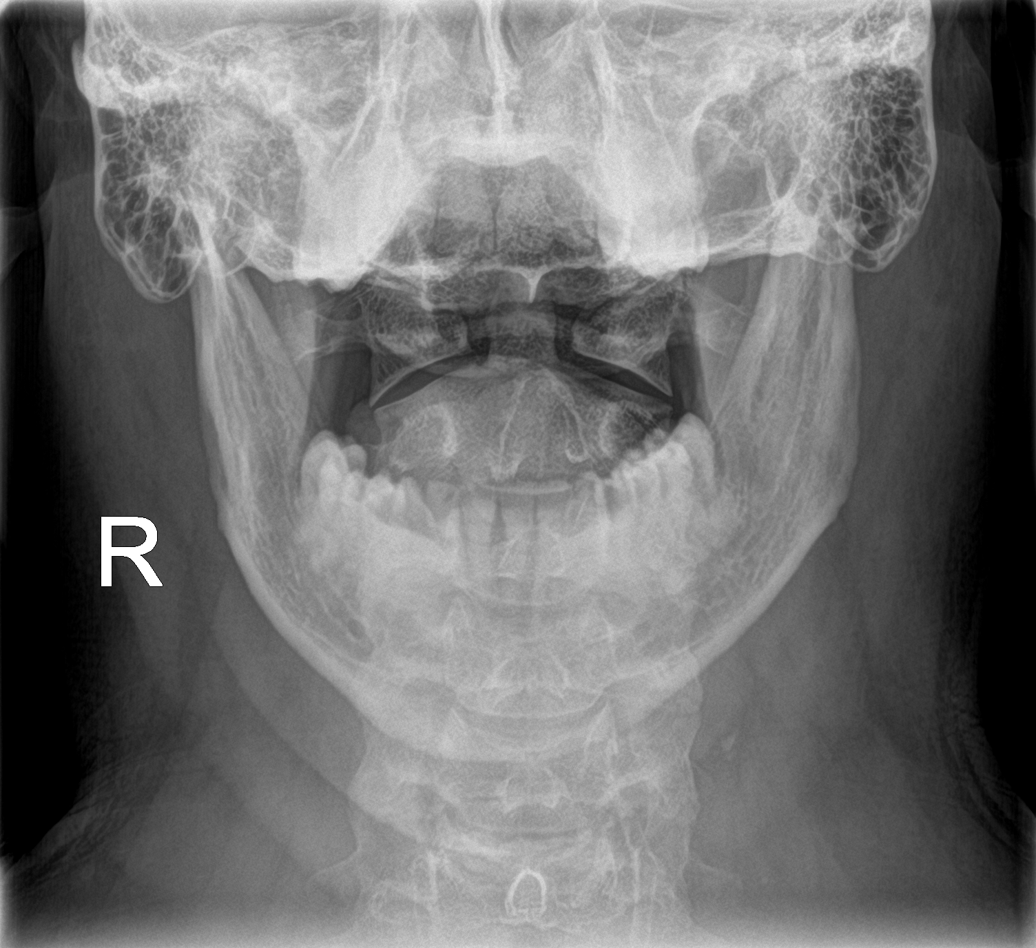

[swimmer]
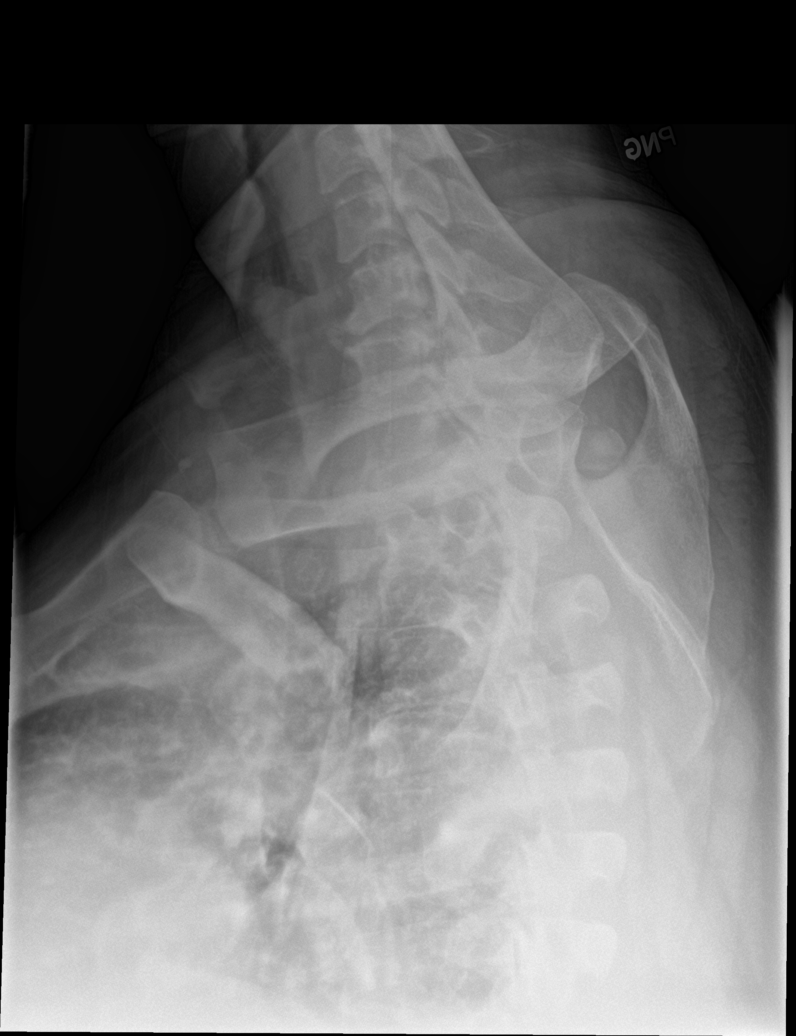

[6 of 6 positions shown; findings below may reference images not displayed]

FINDINGS: C1 to the superior endplate of C7 is imaged on the provided lateral
radiograph. There is obscuration the cervicothoracic junction
secondary overlying osseous soft tissue structures. The
cervicothoracic junction is also obscured on the provided swimmer's
radiograph secondary to overlying osseous and soft tissue
structures.

Straightening of the expected cervical lordosis. No anterolisthesis
or retrolisthesis. The dens is normally positioned between the
lateral masses of C1. The bilateral facets are normally aligned.

Cervical vertebral body heights appear preserved. Prevertebral soft
tissues appear normal.

Mild multilevel cervical spine DDD, worse at C5-C6 and to a lesser
extent, C6-C7 with disc space height loss, endplate irregularity and
sclerosis. Note is made of a prominent anteriorly directed disc
osteophyte at C5-C6.

The bilateral neural foramina appear patent given obliquity.

Atherosclerotic plaque overlies expected location of the left
carotid bulb. Limited visualization of lung apices is normal.
IMPRESSION: 1. Straightening of the expected cervical lordosis, nonspecific
though could be seen in the setting of muscle spasm.
2. Mild multilevel cervical spine DDD, worse at C5 - C6.
3. Atherosclerotic plaque overlies expected location of the left
carotid bulb. Further evaluation with carotid Doppler ultrasound
could be performed as clinically indicated.

## 2018-03-21 ENCOUNTER — Encounter: Payer: Self-pay | Admitting: Family Medicine

## 2018-03-21 ENCOUNTER — Ambulatory Visit (INDEPENDENT_AMBULATORY_CARE_PROVIDER_SITE_OTHER): Payer: BLUE CROSS/BLUE SHIELD | Admitting: Family Medicine

## 2018-03-21 DIAGNOSIS — E119 Type 2 diabetes mellitus without complications: Secondary | ICD-10-CM | POA: Diagnosis not present

## 2018-03-21 DIAGNOSIS — R7303 Prediabetes: Secondary | ICD-10-CM | POA: Diagnosis not present

## 2018-03-21 DIAGNOSIS — E7800 Pure hypercholesterolemia, unspecified: Secondary | ICD-10-CM | POA: Insufficient documentation

## 2018-03-21 DIAGNOSIS — E559 Vitamin D deficiency, unspecified: Secondary | ICD-10-CM

## 2018-03-21 DIAGNOSIS — Z0001 Encounter for general adult medical examination with abnormal findings: Secondary | ICD-10-CM | POA: Diagnosis not present

## 2018-03-21 DIAGNOSIS — Z125 Encounter for screening for malignant neoplasm of prostate: Secondary | ICD-10-CM | POA: Insufficient documentation

## 2018-03-21 NOTE — Progress Notes (Addendum)
Subjective:  Patient ID: Jason Waters, male    DOB: 1971-03-28  Age: 47 y.o. MRN: [id number]  CC: Establish Care   HPI Jason Waters presents for a physical exam and follow-up of his prediabetes and elevated cholesterol.  He is accompanied by his wife today who I am seeing as well.  Patient is nonfasting.  Patient does not smoke or use illicit drugs he rarely drinks alcohol.  He works in the well business.  He does not exercise regularly.  He did have an eye check earlier this year.  Patient's mother is 24 she has congestive heart failure, atrial fibrillation osteoarthritis.  Retired Marine scientist.  Patient's father has diabetes, coronary artery disease peripheral vascular disease hypertension smokes and possibly is an alcoholic. Outpatient Medications Prior to Visit  Medication Sig Dispense Refill  . metFORMIN (GLUCOPHAGE) 500 MG tablet TAKE 1 TABLET BY MOUTH DAILY WITH BREAKFAST 90 tablet 1  . simvastatin (ZOCOR) 20 MG tablet Take 1 tablet (20 mg total) at bedtime by mouth. 90 tablet 3  . Diclofenac Sodium (PENNSAID) 2 % SOLN Place 2 g onto the skin 2 (two) times daily. 112 g 3  . Vitamin D, Ergocalciferol, (DRISDOL) 50000 units CAPS capsule Take 1 capsule (50,000 Units total) by mouth every 7 (seven) days. 12 capsule 0   No facility-administered medications prior to visit.     ROS Review of Systems  Constitutional: Negative for chills, diaphoresis, fatigue, fever and unexpected weight change.  HENT: Negative.   Eyes: Negative for photophobia and visual disturbance.  Respiratory: Negative.   Cardiovascular: Negative.   Gastrointestinal: Negative.   Endocrine: Negative for polyphagia and polyuria.  Genitourinary: Negative.   Musculoskeletal: Negative for gait problem and joint swelling.  Skin: Negative for pallor and rash.  Allergic/Immunologic: Negative for immunocompromised state.  Neurological: Negative for speech difficulty and light-headedness.  Hematological: Does not  bruise/bleed easily.  Psychiatric/Behavioral: Negative.     Objective:  BP 130/80 (BP Location: Right Arm, Patient Position: Sitting, Cuff Size: Large)   Pulse 78   Ht 5\' 10"  (1.778 m)   Wt (!) 302 lb (137 kg)   SpO2 98%   BMI 43.33 kg/m   BP Readings from Last 3 Encounters:  03/21/18 130/80  04/11/17 (!) 154/88  02/22/17 140/80    Wt Readings from Last 3 Encounters:  03/21/18 (!) 302 lb (137 kg)  04/11/17 (!) 311 lb (141.1 kg)  02/22/17 (!) 311 lb (141.1 kg)    Physical Exam  Constitutional: He is oriented to person, place, and time. He appears well-developed and well-nourished. No distress.  HENT:  Head: Normocephalic and atraumatic.  Right Ear: External ear normal.  Left Ear: External ear normal.  Mouth/Throat: Oropharynx is clear and moist. No oropharyngeal exudate.  Eyes: Pupils are equal, round, and reactive to light. Conjunctivae and EOM are normal. Right eye exhibits no discharge. Left eye exhibits no discharge. No scleral icterus.  Neck: Neck supple. No JVD present. No tracheal deviation present. No thyromegaly present.  Cardiovascular: Normal rate, regular rhythm and normal heart sounds.  Pulmonary/Chest: Effort normal and breath sounds normal.  Abdominal: Bowel sounds are normal.  Lymphadenopathy:    He has no cervical adenopathy.  Neurological: He is alert and oriented to person, place, and time.  Skin: Skin is warm and dry. He is not diaphoretic.  Psychiatric: He has a normal mood and affect. His behavior is normal.    Lab Results  Component Value Date   WBC 8.3 03/22/2018  HGB 15.3 03/22/2018   HCT 44.8 03/22/2018   PLT 289.0 03/22/2018   GLUCOSE 164 (H) 03/22/2018   CHOL 148 03/22/2018   TRIG 143.0 03/22/2018   HDL 34.10 (L) 03/22/2018   LDLDIRECT 113.0 02/22/2017   LDLCALC 85 03/22/2018   ALT 34 03/22/2018   AST 20 03/22/2018   NA 138 03/22/2018   K 4.7 03/22/2018   CL 103 03/22/2018   CREATININE 0.89 03/22/2018   BUN 18 03/22/2018   CO2  26 03/22/2018   TSH 2.84 03/22/2018   INR 0.99 07/03/2015   HGBA1C 6.7 (H) 03/22/2018    Dg Cervical Spine Complete  Result Date: 04/12/2017 CLINICAL DATA:  Posterior neck pain for the past 2 years. Arm numbness. No known injury. History of right clavicular fracture. EXAM: CERVICAL SPINE - COMPLETE 4+ VIEW COMPARISON:  None. FINDINGS: C1 to the superior endplate of C7 is imaged on the provided lateral radiograph. There is obscuration the cervicothoracic junction secondary overlying osseous soft tissue structures. The cervicothoracic junction is also obscured on the provided swimmer's radiograph secondary to overlying osseous and soft tissue structures. Straightening of the expected cervical lordosis. No anterolisthesis or retrolisthesis. The dens is normally positioned between the lateral masses of C1. The bilateral facets are normally aligned. Cervical vertebral body heights appear preserved. Prevertebral soft tissues appear normal. Mild multilevel cervical spine DDD, worse at C5-C6 and to a lesser extent, C6-C7 with disc space height loss, endplate irregularity and sclerosis. Note is made of a prominent anteriorly directed disc osteophyte at C5-C6. The bilateral neural foramina appear patent given obliquity. Atherosclerotic plaque overlies expected location of the left carotid bulb. Limited visualization of lung apices is normal. IMPRESSION: 1. Straightening of the expected cervical lordosis, nonspecific though could be seen in the setting of muscle spasm. 2. Mild multilevel cervical spine DDD, worse at C5 - C6. 3. Atherosclerotic plaque overlies expected location of the left carotid bulb. Further evaluation with carotid Doppler ultrasound could be performed as clinically indicated. Electronically Signed   By: Sandi Mariscal M.D.   On: 04/12/2017 08:32   [place] Limited Joint Space Structures Up Left  Result Date: 04/15/2017 [facility] US performed of: left This study was ordered, performed, and interpreted by Charlann Boxer D.O.  Shoulder:  Supraspinatus:  Appears normal on long and transverse views, Bursal bulge seen with shoulder abduction on impingement view.  Ossific changes noted Subscapularis:  Appears normal on long and transverse views. Positive bursa AC joint: Significant capsular distention with mild arthritis Glenohumeral Joint:  Appears normal without effusion. Glenoid Labrum:  Intact without visualized tears. Biceps Tendon:  Appears normal on long and transverse views, no fraying of tendon, tendon located in intertubercular groove, no subluxation with shoulder internal or external rotation.  Impression: Subacromial bursitis, calcific, distal clavicle also has some type of potential clavicle fracture, acromioclavicular arthritis  Procedure: Real-time Ultrasound Guided Injection of left acromioclavicular joint Device: GE Logiq E Ultrasound guided injection is preferred based studies that show increased duration, increased effect, greater accuracy, decreased procedural pain, increased response rate with ultrasound guided versus blind injection. Verbal informed consent obtained. Time-out conducted. Noted no overlying erythema, induration, or other signs of local infection. Skin prepped in a sterile fashion. Local anesthesia: Topical Ethyl chloride. With sterile technique and under real time ultrasound guidance:  Joint visualized.  23g 1  inch needle inserted.  Approach. Pictures taken for needle placement. Patient did have injection of 1 cc of 0.5% Marcaine, and 1.0 cc of Kenalog 40 mg/dL. Completed  without difficulty Pain immediately resolved suggesting accurate placement of the medication. Advised to call if fevers/chills, erythema, induration, drainage, or persistent bleeding. Images permanently stored and available for review in the ultrasound unit. Impression: Technically successful ultrasound guided injection.  Dg Shoulder Left  Result Date: 04/12/2017 CLINICAL DATA:  Left shoulder pain for the past 2  years. No known injury. EXAM: LEFT SHOULDER - 2+ VIEW COMPARISON:  None. FINDINGS: No fracture or dislocation. Glenohumeral joint spaces appear preserved. Mild degenerative change of the left AC joint with joint space loss, subchondral sclerosis and osteophytosis. Minimal enthesopathic change involving the undersurface of the distal end of the clavicle. No evidence of calcific tendinitis. Limited visualization adjacent thorax is normal. Regional soft tissues appear normal. IMPRESSION: Mild degenerative change of the left AC joint and enthesopathic change involving the undersurface the distal end of the left clavicle. Otherwise, no explanation for patient's chronic left shoulder pain. Further evaluation shoulder MRI could performed as indicated. Electronically Signed   By: Sandi Mariscal M.D.   On: 04/12/2017 08:26    Assessment & Plan:   Dhruva was seen today for establish care.  Diagnoses and all orders for this visit:  Morbid obesity ([place]) -     TSH; Future -     VITAMIN D 25 Hydroxy (Vit-D Deficiency, Fractures); Future -     Amb ref to Medical Nutrition Therapy-MNT  Pre-diabetes -     CBC; Future -     Comprehensive metabolic panel; Future -     Hemoglobin A1c; Future -     Urinalysis, Routine w reflex microscopic; Future -     VITAMIN D 25 Hydroxy (Vit-D Deficiency, Fractures); Future -     Amb ref to Medical Nutrition Therapy-MNT  Elevated LDL cholesterol level -     CBC; Future -     Comprehensive metabolic panel; Future -     Lipid panel; Future -     VITAMIN D 25 Hydroxy (Vit-D Deficiency, Fractures); Future -     simvastatin (ZOCOR) 20 MG tablet; Take 1 tablet (20 mg total) by mouth at bedtime.  Encounter for health maintenance examination with abnormal findings  Controlled type 2 diabetes mellitus without complication, without long-term current use of insulin (HCC) -     metFORMIN (GLUCOPHAGE-XR) 500 MG 24 hr tablet; Take 1 tablet (500 mg total) by mouth at  bedtime.  Vitamin D deficiency -     Vitamin D, Ergocalciferol, (DRISDOL) 1.25 MG (50000 UT) CAPS capsule; Take 1 capsule (50,000 Units total) by mouth every 7 (seven) days.   I have discontinued Jason Waters Vitamin D (Ergocalciferol), Diclofenac Sodium, and metFORMIN. I have also changed his simvastatin. Additionally, I am having him start on metFORMIN and Vitamin D (Ergocalciferol).  Meds ordered this encounter  Medications  . metFORMIN (GLUCOPHAGE-XR) 500 MG 24 hr tablet    Sig: Take 1 tablet (500 mg total) by mouth at bedtime.    Dispense:  90 tablet    Refill:  1  . Vitamin D, Ergocalciferol, (DRISDOL) 1.25 MG (50000 UT) CAPS capsule    Sig: Take 1 capsule (50,000 Units total) by mouth every 7 (seven) days.    Dispense:  30 capsule    Refill:  1  . simvastatin (ZOCOR) 20 MG tablet    Sig: Take 1 tablet (20 mg total) by mouth at bedtime.    Dispense:  90 tablet    Refill:  3   Patient to return fasting for above ordered blood work.  He was given information on BMI and exercising to lose weight.  Anticipatory guidance was given for health maintenance and disease prevention.  Has been referred to nutrition counseling.  Follow-up: Return in about 3 months (around 06/21/2018).  [name], MD

## 2018-03-21 NOTE — Patient Instructions (Signed)
BMI for Adults Body mass index (BMI) is a number that is calculated from a person's weight and height. In most adults, the number is used to find how much of an adult's weight is made up of fat. BMI is not as accurate as a direct measure of body fat. How is BMI calculated? BMI is calculated by dividing weight in kilograms by height in meters squared. It can also be calculated by dividing weight in pounds by height in inches squared, then multiplying the resulting number by 703. Charts are available to help you find your BMI quickly and easily without doing this calculation. How is BMI interpreted? Health care professionals use BMI charts to identify whether an adult is underweight, at a normal weight, or overweight based on the following guidelines:  Underweight: BMI less than 18.5.  Normal weight: BMI between 18.5 and 24.9.  Overweight: BMI between 25 and 29.9.  Obese: BMI of 30 and above.  BMI is usually interpreted the same for males and females. Weight includes both fat and muscle, so someone with a muscular build, such as an athlete, may have a BMI that is higher than 24.9. In cases like these, BMI may not accurately depict body fat. To determine if excess body fat is the cause of a BMI of 25 or higher, further assessments may need to be done by a health care provider. Why is BMI a useful tool? BMI is used to identify a possible weight problem that may be related to a medical problem or may increase the risk for medical problems. BMI can also be used to promote changes to reach a healthy weight. This information is not intended to replace advice given to you by your health care provider. Make sure you discuss any questions you have with your health care provider. Document Released: 01/13/2004 Document Revised: 09/11/2015 Document Reviewed: 09/28/2013 Elsevier Interactive Patient Education  2018 [employer].  Diabetes Mellitus and Standards of Medical Care Managing diabetes (diabetes  mellitus) can be complicated. Your diabetes treatment may be managed by a team of health care providers, including:  A diet and nutrition specialist (registered dietitian).  A nurse.  A certified diabetes educator (CDE).  A diabetes specialist (endocrinologist).  An eye doctor.  A primary care provider.  A dentist.  Your health care providers follow a schedule in order to help you get the best quality of care. The following schedule is a general guideline for your diabetes management plan. Your health care providers may also give you more specific instructions. HbA1c ( hemoglobin A1c) test This test provides information about blood sugar (glucose) control over the previous 2-3 months. It is used to check whether your diabetes management plan needs to be adjusted.  If you are meeting your treatment goals, this test is done at least 2 times a year.  If you are not meeting treatment goals or if your treatment goals have changed, this test is done 4 times a year.  Blood pressure test  This test is done at every routine medical visit. For most people, the goal is less than 130/80. Ask your health care provider what your goal blood pressure should be. Dental and eye exams  Visit your dentist two times a year.  If you have type 1 diabetes, get an eye exam 3-5 years after you are diagnosed, and then once a year after your first exam. ? If you were diagnosed with type 1 diabetes as a child, get an eye exam when you are age  10 or older and have had diabetes for 3-5 years. After the first exam, you should get an eye exam once a year.  If you have type 2 diabetes, have an eye exam as soon as you are diagnosed, and then once a year after your first exam. Foot care exam  Visual foot exams are done at every routine medical visit. The exams check for cuts, bruises, redness, blisters, sores, or other problems with the feet.  A complete foot exam is done by your health care provider once a  year. This exam includes an inspection of the structure and skin of your feet, and a check of the pulses and sensation in your feet. ? Type 1 diabetes: Get your first exam 3-5 years after diagnosis. ? Type 2 diabetes: Get your first exam as soon as you are diagnosed.  Check your feet every day for cuts, bruises, redness, blisters, or sores. If you have any of these or other problems that are not healing, contact your health care provider. Kidney function test ( urine microalbumin)  This test is done once a year. ? Type 1 diabetes: Get your first test 5 years after diagnosis. ? Type 2 diabetes: Get your first test as soon as you are diagnosed.  If you have chronic kidney disease (CKD), get a serum creatinine and estimated glomerular filtration rate (eGFR) test once a year. Lipid profile (cholesterol, HDL, LDL, triglycerides)  This test should be done when you are diagnosed with diabetes, and every 5 years after the first test. If you are on medicines to lower your cholesterol, you may need to get this test done every year. ? The goal for LDL is less than 100 mg/dL (5.5 mmol/L). If you are at high risk, the goal is less than 70 mg/dL (3.9 mmol/L). ? The goal for HDL is 40 mg/dL (2.2 mmol/L) for men and 50 mg/dL(2.8 mmol/L) for women. An HDL cholesterol of 60 mg/dL (3.3 mmol/L) or higher gives some protection against heart disease. ? The goal for triglycerides is less than 150 mg/dL (8.3 mmol/L). Immunizations  The yearly flu (influenza) vaccine is recommended for everyone 6 months or older who has diabetes.  The pneumonia (pneumococcal) vaccine is recommended for everyone 2 years or older who has diabetes. If you are 74 or older, you may get the pneumonia vaccine as a series of two separate shots.  The hepatitis B vaccine is recommended for adults shortly after they have been diagnosed with diabetes.  The Tdap (tetanus, diphtheria, and pertussis) vaccine should be given: ? According to  normal childhood vaccination schedules, for children. ? Every 10 years, for adults who have diabetes.  The shingles vaccine is recommended for people who have had chicken pox and are 50 years or older. Mental and emotional health  Screening for symptoms of eating disorders, anxiety, and depression is recommended at the time of diagnosis and afterward as needed. If your screening shows that you have symptoms (you have a positive screening result), you may need further evaluation and be referred to a mental health care provider. Diabetes self-management education  Education about how to manage your diabetes is recommended at diagnosis and ongoing as needed. Treatment plan  Your treatment plan will be reviewed at every medical visit. Summary  Managing diabetes (diabetes mellitus) can be complicated. Your diabetes treatment may be managed by a team of health care providers.  Your health care providers follow a schedule in order to help you get the best quality of care.  Standards of care including having regular physical exams, blood tests, blood pressure monitoring, immunizations, screening tests, and education about how to manage your diabetes.  Your health care providers may also give you more specific instructions based on your individual health. This information is not intended to replace advice given to you by your health care provider. Make sure you discuss any questions you have with your health care provider. Document Released: 02/28/2009 Document Revised: 01/30/2016 Document Reviewed: 01/30/2016 Elsevier Interactive Patient Education  2018 [employer].  Exercising to [employer] Exercising can help you to lose weight. In order to lose weight through exercise, you need to do vigorous-intensity exercise. You can tell that you are exercising with vigorous intensity if you are breathing very hard and fast and cannot hold a conversation while exercising. Moderate-intensity exercise  helps to maintain your current weight. You can tell that you are exercising at a moderate level if you have a higher heart rate and faster breathing, but you are still able to hold a conversation. How often should I exercise? Choose an activity that you enjoy and set realistic goals. Your health care provider can help you to make an activity plan that works for you. Exercise regularly as directed by your health care provider. This may include:  Doing resistance training twice each week, such as: ? Push-ups. ? Sit-ups. ? Lifting weights. ? Using resistance bands.  Doing a given intensity of exercise for a given amount of time. Choose from these options: ? 150 minutes of moderate-intensity exercise every week. ? 75 minutes of vigorous-intensity exercise every week. ? A mix of moderate-intensity and vigorous-intensity exercise every week.  Children, pregnant women, people who are out of shape, people who are overweight, and older adults may need to consult a health care provider for individual recommendations. If you have any sort of medical condition, be sure to consult your health care provider before starting a new exercise program. What are some activities that can help me to lose weight?  Walking at a rate of at least 4.5 miles an hour.  Jogging or running at a rate of 5 miles per hour.  Biking at a rate of at least 10 miles per hour.  Lap swimming.  Roller-skating or in-line skating.  Cross-country skiing.  Vigorous competitive sports, such as football, basketball, and soccer.  Jumping rope.  Aerobic dancing. How can I be more active in my day-to-day activities?  Use the stairs instead of the elevator.  Take a walk during your lunch break.  If you drive, park your car farther away from work or school.  If you take public transportation, get off one stop early and walk the rest of the way.  Make all of your phone calls while standing up and walking around.  Get up,  stretch, and walk around every 30 minutes throughout the day. What guidelines should I follow while exercising?  Do not exercise so much that you hurt yourself, feel dizzy, or get very short of breath.  Consult your health care provider prior to starting a new exercise program.  Wear comfortable clothes and shoes with good support.  Drink plenty of water while you exercise to prevent dehydration or heat stroke. Body water is lost during exercise and must be replaced.  Work out until you breathe faster and your heart beats faster. This information is not intended to replace advice given to you by your health care provider. Make sure you discuss any questions you have with your health  care provider. Document Released: 06/05/2010 Document Revised: 10/09/2015 Document Reviewed: 10/04/2013 Elsevier Interactive Patient Education  2018 [place] Maintenance, Male A healthy lifestyle and preventive care is important for your health and wellness. Ask your health care provider about what schedule of regular examinations is right for you. What should I know about weight and diet? Eat a Healthy Diet  Eat plenty of vegetables, fruits, whole grains, low-fat dairy products, and lean protein.  Do not eat a lot of foods high in solid fats, added sugars, or salt.  Maintain a Healthy Weight Regular exercise can help you achieve or maintain a healthy weight. You should:  Do at least 150 minutes of exercise each week. The exercise should increase your heart rate and make you sweat (moderate-intensity exercise).  Do strength-training exercises at least twice a week.  Watch Your Levels of Cholesterol and Blood Lipids  Have your blood tested for lipids and cholesterol every 5 years starting at 47 years of age. If you are at high risk for heart disease, you should start having your blood tested when you are 47 years old. You may need to have your cholesterol levels checked more often  if: ? Your lipid or cholesterol levels are high. ? You are older than 47 years of age. ? You are at high risk for heart disease.  What should I know about cancer screening? Many types of cancers can be detected early and may often be prevented. Lung Cancer  You should be screened every year for lung cancer if: ? You are a current smoker who has smoked for at least 30 years. ? You are a former smoker who has quit within the past 15 years.  Talk to your health care provider about your screening options, when you should start screening, and how often you should be screened.  Colorectal Cancer  Routine colorectal cancer screening usually begins at 47 years of age and should be repeated every 5-10 years until you are 47 years old. You may need to be screened more often if early forms of precancerous polyps or small growths are found. Your health care provider may recommend screening at an earlier age if you have risk factors for colon cancer.  Your health care provider may recommend using home test kits to check for hidden blood in the stool.  A small camera at the end of a tube can be used to examine your colon (sigmoidoscopy or colonoscopy). This checks for the earliest forms of colorectal cancer.  Prostate and Testicular Cancer  Depending on your age and overall health, your health care provider may do certain tests to screen for prostate and testicular cancer.  Talk to your health care provider about any symptoms or concerns you have about testicular or prostate cancer.  Skin Cancer  Check your skin from head to toe regularly.  Tell your health care provider about any new moles or changes in moles, especially if: ? There is a change in a mole's size, shape, or color. ? You have a mole that is larger than a pencil eraser.  Always use sunscreen. Apply sunscreen liberally and repeat throughout the day.  Protect yourself by wearing long sleeves, pants, a wide-brimmed hat, and  sunglasses when outside.  What should I know about heart disease, diabetes, and high blood pressure?  If you are 94-24 years of age, have your blood pressure checked every 3-5 years. If you are 97 years of age or older, have your blood pressure checked  every year. You should have your blood pressure measured twice-once when you are at a hospital or clinic, and once when you are not at a hospital or clinic. Record the average of the two measurements. To check your blood pressure when you are not at a hospital or clinic, you can use: ? An automated blood pressure machine at a pharmacy. ? A home blood pressure monitor.  Talk to your health care provider about your target blood pressure.  If you are between 48-13 years old, ask your health care provider if you should take aspirin to prevent heart disease.  Have regular diabetes screenings by checking your fasting blood sugar level. ? If you are at a normal weight and have a low risk for diabetes, have this test once every three years after the age of 87. ? If you are overweight and have a high risk for diabetes, consider being tested at a younger age or more often.  A one-time screening for abdominal aortic aneurysm (AAA) by ultrasound is recommended for men aged 53-75 years who are current or former smokers. What should I know about preventing infection? Hepatitis B If you have a higher risk for hepatitis B, you should be screened for this virus. Talk with your health care provider to find out if you are at risk for hepatitis B infection. Hepatitis C Blood testing is recommended for:  Everyone born from [birth year] through 1965.  Anyone with known risk factors for hepatitis C.  Sexually Transmitted Diseases (STDs)  You should be screened each year for STDs including gonorrhea and chlamydia if: ? You are sexually active and are younger than 47 years of age. ? You are older than 47 years of age and your health care provider tells you that you are  at risk for this type of infection. ? Your sexual activity has changed since you were last screened and you are at an increased risk for chlamydia or gonorrhea. Ask your health care provider if you are at risk.  Talk with your health care provider about whether you are at high risk of being infected with HIV. Your health care provider may recommend a prescription medicine to help prevent HIV infection.  What else can I do?  Schedule regular health, dental, and eye exams.  Stay current with your vaccines (immunizations).  Do not use any tobacco products, such as cigarettes, chewing tobacco, and e-cigarettes. If you need help quitting, ask your health care provider.  Limit alcohol intake to no more than 2 drinks per day. One drink equals 12 ounces of beer, 5 ounces of wine, or 1 ounces of hard liquor.  Do not use street drugs.  Do not share needles.  Ask your health care provider for help if you need support or information about quitting drugs.  Tell your health care provider if you often feel depressed.  Tell your health care provider if you have ever been abused or do not feel safe at home. This information is not intended to replace advice given to you by your health care provider. Make sure you discuss any questions you have with your health care provider. Document Released: 10/30/2007 Document Revised: 12/31/2015 Document Reviewed: 02/04/2015 Elsevier Interactive Patient Education  [employer].

## 2018-03-22 ENCOUNTER — Other Ambulatory Visit (INDEPENDENT_AMBULATORY_CARE_PROVIDER_SITE_OTHER): Payer: BLUE CROSS/BLUE SHIELD

## 2018-03-22 DIAGNOSIS — R7303 Prediabetes: Secondary | ICD-10-CM | POA: Diagnosis not present

## 2018-03-22 DIAGNOSIS — E7800 Pure hypercholesterolemia, unspecified: Secondary | ICD-10-CM

## 2018-03-22 LAB — CBC
HCT: 44.8 % (ref 39.0–52.0)
Hemoglobin: 15.3 g/dL (ref 13.0–17.0)
MCHC: 34.2 g/dL (ref 30.0–36.0)
MCV: 84.9 fl (ref 78.0–100.0)
Platelets: 289 10*3/uL (ref 150.0–400.0)
RBC: 5.28 Mil/uL (ref 4.22–5.81)
RDW: 13.1 % (ref 11.5–15.5)
WBC: 8.3 10*3/uL (ref 4.0–10.5)

## 2018-03-22 LAB — LIPID PANEL
Cholesterol: 148 mg/dL (ref 0–200)
HDL: 34.1 mg/dL — ABNORMAL LOW (ref >39.00)
LDL Cholesterol: 85 mg/dL (ref 0–99)
NonHDL: 113.63
Total CHOL/HDL Ratio: 4
Triglycerides: 143 mg/dL (ref 0.0–149.0)
VLDL: 28.6 mg/dL (ref 0.0–40.0)

## 2018-03-22 LAB — VITAMIN D 25 HYDROXY (VIT D DEFICIENCY, FRACTURES): VITD: 23.31 ng/mL — ABNORMAL LOW (ref 30.00–100.00)

## 2018-03-22 LAB — URINALYSIS, ROUTINE W REFLEX MICROSCOPIC
Bilirubin Urine: NEGATIVE
Hgb urine dipstick: NEGATIVE
Ketones, ur: NEGATIVE
Leukocytes, UA: NEGATIVE
Nitrite: NEGATIVE
RBC / HPF: NONE SEEN (ref 0–?)
Specific Gravity, Urine: 1.02 (ref 1.000–1.030)
Urine Glucose: NEGATIVE
Urobilinogen, UA: 0.2 (ref 0.0–1.0)
pH: 6 (ref 5.0–8.0)

## 2018-03-22 LAB — COMPREHENSIVE METABOLIC PANEL
ALT: 34 U/L (ref 0–53)
AST: 20 U/L (ref 0–37)
Albumin: 4.3 g/dL (ref 3.5–5.2)
Alkaline Phosphatase: 74 U/L (ref 39–117)
BUN: 18 mg/dL (ref 6–23)
CO2: 26 mEq/L (ref 19–32)
Calcium: 9.5 mg/dL (ref 8.4–10.5)
Chloride: 103 mEq/L (ref 96–112)
Creatinine, Ser: 0.89 mg/dL (ref 0.40–1.50)
GFR: 97.26 mL/min (ref >60.00)
Glucose, Bld: 164 mg/dL — ABNORMAL HIGH (ref 70–99)
Potassium: 4.7 mEq/L (ref 3.5–5.1)
Sodium: 138 mEq/L (ref 135–145)
Total Bilirubin: 0.7 mg/dL (ref 0.2–1.2)
Total Protein: 6.5 g/dL (ref 6.0–8.3)

## 2018-03-22 LAB — HEMOGLOBIN A1C: Hgb A1c MFr Bld: 6.7 % — ABNORMAL HIGH (ref 4.6–6.5)

## 2018-03-22 LAB — TSH: TSH: 2.84 u[IU]/mL (ref 0.35–4.50)

## 2018-03-23 DIAGNOSIS — E559 Vitamin D deficiency, unspecified: Secondary | ICD-10-CM | POA: Insufficient documentation

## 2018-03-23 DIAGNOSIS — E119 Type 2 diabetes mellitus without complications: Secondary | ICD-10-CM | POA: Insufficient documentation

## 2018-03-23 MED ORDER — VITAMIN D (ERGOCALCIFEROL) 1.25 MG (50000 UNIT) PO CAPS: 50000.0000 [IU] | ORAL_CAPSULE | ORAL | 1 refills | Status: DC

## 2018-03-23 MED ORDER — METFORMIN HCL ER 500 MG PO TB24: 500.0000 mg | ORAL_TABLET | Freq: Every day | ORAL | 1 refills | Status: DC

## 2018-03-23 NOTE — Addendum Note (Signed)
Addended by: Jon Billings on: 03/23/2018 09:20 AM   Modules accepted: Orders

## 2018-03-24 ENCOUNTER — Other Ambulatory Visit: Payer: Self-pay | Admitting: Internal Medicine

## 2018-03-24 MED ORDER — SIMVASTATIN 20 MG PO TABS: 20.0000 mg | ORAL_TABLET | Freq: Every day | ORAL | 3 refills | Status: DC

## 2018-03-24 NOTE — Telephone Encounter (Signed)
Routing to patient's PCP

## 2018-03-24 NOTE — Addendum Note (Signed)
Addended by: Abelino Derrick A on: 03/24/2018 11:51 AM   Modules accepted: Orders

## 2018-06-22 ENCOUNTER — Ambulatory Visit: Payer: BLUE CROSS/BLUE SHIELD | Admitting: Family Medicine

## 2018-06-22 ENCOUNTER — Encounter: Payer: Self-pay | Admitting: Family Medicine

## 2018-06-22 VITALS — BP 120/80 | HR 90 | Ht 70.0 in | Wt 309.0 lb

## 2018-06-22 DIAGNOSIS — R361 Hematospermia: Secondary | ICD-10-CM

## 2018-06-22 DIAGNOSIS — E7800 Pure hypercholesterolemia, unspecified: Secondary | ICD-10-CM | POA: Diagnosis not present

## 2018-06-22 DIAGNOSIS — E119 Type 2 diabetes mellitus without complications: Secondary | ICD-10-CM | POA: Diagnosis not present

## 2018-06-22 DIAGNOSIS — E559 Vitamin D deficiency, unspecified: Secondary | ICD-10-CM | POA: Diagnosis not present

## 2018-06-22 DIAGNOSIS — D1720 Benign lipomatous neoplasm of skin and subcutaneous tissue of unspecified limb: Secondary | ICD-10-CM | POA: Diagnosis not present

## 2018-06-22 DIAGNOSIS — G540 Brachial plexus disorders: Secondary | ICD-10-CM | POA: Insufficient documentation

## 2018-06-22 NOTE — Patient Instructions (Signed)
Lipoma    A lipoma is a noncancerous (benign) tumor that is made up of fat cells. This is a very common type of soft-tissue growth. Lipomas are usually found under the skin (subcutaneous). They may occur in any tissue of the body that contains fat. Common areas for lipomas to appear include the back, shoulders, buttocks, and thighs.   Lipomas grow slowly, and they are usually painless. Most lipomas do not cause problems and do not require treatment.  What are the causes?  The cause of this condition is not known.  What increases the risk?  You are more likely to develop this condition if:   You are 40-60 years old.   You have a family history of lipomas.  What are the signs or symptoms?  A lipoma usually appears as a small, round bump under the skin. In most cases, the lump will:   Feel soft or rubbery.   Not cause pain or other symptoms.  However, if a lipoma is located in an area where it pushes on nerves, it can become painful or cause other symptoms.  How is this diagnosed?  A lipoma can usually be diagnosed with a physical exam. You may also have tests to confirm the diagnosis and to rule out other conditions. Tests may include:   Imaging tests, such as a CT scan or MRI.   Removal of a tissue sample to be looked at under a microscope (biopsy).  How is this treated?  Treatment for this condition depends on the size of the lipoma and whether it is causing any symptoms.   For small lipomas that are not causing problems, no treatment is needed.   If a lipoma is bigger or it causes problems, surgery may be done to remove the lipoma. Lipomas can also be removed to improve appearance. Most often, the procedure is done after applying a medicine that numbs the area (local anesthetic).  Follow these instructions at home:   Watch your lipoma for any changes.   Keep all follow-up visits as told by your health care provider. This is important.  Contact a health care provider if:   Your lipoma becomes larger or  hard.   Your lipoma becomes painful, red, or increasingly swollen. These could be signs of infection or a more serious condition.  Get help right away if:   You develop tingling or numbness in an area near the lipoma. This could indicate that the lipoma is causing nerve damage.  Summary   A lipoma is a noncancerous tumor that is made up of fat cells.   Most lipomas do not cause problems and do not require treatment.   If a lipoma is bigger or it causes problems, surgery may be done to remove the lipoma.  This information is not intended to replace advice given to you by your health care provider. Make sure you discuss any questions you have with your health care provider.  Document Released: 04/23/2002 Document Revised: 04/19/2017 Document Reviewed: 04/19/2017  Elsevier Interactive Patient Education  2019 [employer]

## 2018-06-22 NOTE — Progress Notes (Addendum)
Established Patient Office Visit  Subjective:  Patient ID: Jason Waters, male    DOB: 11-12-1970  Age: 48 y.o. MRN: [id number]  CC:  Chief Complaint  Patient presents with  . Follow-up    HPI Jason Waters presents for follow-up of his type 2 diabetes that is been well controlled with Glucophage.  He is doing well taking it at bedtime.  He is taking his Zocor at night as well.  He continues high-dose vitamin D.  He tells of a longstanding history of numbness and tingling moving down the lateral aspect of his arm into his hand.  He denies neck pain or stiffness.  He has sustained injuries to his left shoulder in his distant past and clavicle.  Has occasional episodes of hemospermia.  These are rare and occur once or twice a year.  He denies a decrease in the force of his stream.  He does get up at night to urinate once but admits to drinking fluids at night.  He has no other symptoms referrable to the prostate.  He has pebbles that form in his gluteal cleft on occasion that he is able to drain himself.  These are not near the anus.  He was unable to go for medical weight loss management due to work restraints.  Past Medical History:  Diagnosis Date  . Medical history non-contributory     Past Surgical History:  Procedure Laterality Date  . HAND SURGERY Left   . LUMBAR LAMINECTOMY/DECOMPRESSION MICRODISCECTOMY Right 07/09/2015   Procedure: RIGHT SIDED LUMBAR 5-SACRUM 1 MICRODISECTOMY;  Surgeon: Phylliss Bob, MD;  Location: [facility];  Service: Orthopedics;  Laterality: Right;  Right sided lumbar 5-sacrum 1 microdisectomy  . WISDOM TOOTH EXTRACTION      Family History  Problem Relation Age of Onset  . Diabetes Father   . Heart disease Father     Social History   Socioeconomic History  . Marital status: Married    Spouse name: Not on file  . Number of children: Not on file  . Years of education: Not on file  . Highest education level: Not on file  Occupational History  .  Not on file  Social Needs  . Financial resource strain: Not on file  . Food insecurity:    Worry: Not on file    Inability: Not on file  . Transportation needs:    Medical: Not on file    Non-medical: Not on file  Tobacco Use  . Smoking status: Never Smoker  . Smokeless tobacco: Never Used  Substance and Sexual Activity  . Alcohol use: Yes    Comment: rarely  . Drug use: No  . Sexual activity: Yes    Birth control/protection: None  Lifestyle  . Physical activity:    Days per week: Not on file    Minutes per session: Not on file  . Stress: Not on file  Relationships  . Social connections:    Talks on phone: Not on file    Gets together: Not on file    Attends religious service: Not on file    Active member of club or organization: Not on file    Attends meetings of clubs or organizations: Not on file    Relationship status: Not on file  . Intimate partner violence:    Fear of current or ex partner: Not on file    Emotionally abused: Not on file    Physically abused: Not on file    Forced sexual  activity: Not on file  Other Topics Concern  . Not on file  Social History Narrative  . Not on file    Outpatient Medications Prior to Visit  Medication Sig Dispense Refill  . metFORMIN (GLUCOPHAGE-XR) 500 MG 24 hr tablet Take 1 tablet (500 mg total) by mouth at bedtime. 90 tablet 1  . simvastatin (ZOCOR) 20 MG tablet Take 1 tablet (20 mg total) by mouth at bedtime. 90 tablet 3  . Vitamin D, Ergocalciferol, (DRISDOL) 1.25 MG (50000 UT) CAPS capsule Take 1 capsule (50,000 Units total) by mouth every 7 (seven) days. 30 capsule 1   No facility-administered medications prior to visit.     Allergies  Allergen Reactions  . Bee Venom     ROS Review of Systems  Constitutional: Negative.   HENT: Negative.   Eyes: Negative for photophobia and visual disturbance.  Respiratory: Negative.   Cardiovascular: Negative.   Gastrointestinal: Negative.   Endocrine: Negative for  polyphagia and polyuria.  Genitourinary: Negative for decreased urine volume, difficulty urinating, frequency and urgency.  Musculoskeletal: Negative for neck pain and neck stiffness.  Skin: Negative for color change and pallor.  Allergic/Immunologic: Negative for immunocompromised state.  Neurological: Positive for weakness and numbness. Negative for light-headedness and headaches.  Hematological: Does not bruise/bleed easily.  Psychiatric/Behavioral: Negative.       Objective:    Physical Exam  Constitutional: He is oriented to person, place, and time. He appears well-developed and well-nourished. No distress.  HENT:  Head: Normocephalic and atraumatic.  Right Ear: External ear normal.  Left Ear: External ear normal.  Mouth/Throat: Oropharynx is clear and moist. No oropharyngeal exudate.  Eyes: Pupils are equal, round, and reactive to light. Conjunctivae are normal. Right eye exhibits no discharge. Left eye exhibits no discharge. No scleral icterus.  Neck: Neck supple. No JVD present. No tracheal deviation present. No thyromegaly present.  Cardiovascular: Normal rate, regular rhythm and normal heart sounds.  Pulmonary/Chest: Effort normal and breath sounds normal. No stridor.  Musculoskeletal:     Cervical back: He exhibits normal range of motion, no tenderness, no bony tenderness, no deformity and no spasm.  Lymphadenopathy:    He has no cervical adenopathy.  Neurological: He is alert and oriented to person, place, and time. He has normal strength.  Reflex Scores:      Tricep reflexes are 1+ on the right side and 1+ on the left side.      Bicep reflexes are 1+ on the right side and 1+ on the left side.      Brachioradialis reflexes are 1+ on the right side and 1+ on the left side. Negative spurlings/dural tension signs.   Skin: Skin is warm and dry. He is not diaphoretic.     Psychiatric: He has a normal mood and affect. His behavior is normal.    BP 120/80   Pulse 90    Ht 5\' 10"  (1.778 m)   Wt (!) 309 lb (140.2 kg)   SpO2 96%   BMI 44.34 kg/m  Wt Readings from Last 3 Encounters:  06/22/18 (!) 309 lb (140.2 kg)  03/21/18 (!) 302 lb (137 kg)  04/11/17 (!) 311 lb (141.1 kg)   BP Readings from Last 3 Encounters:  06/22/18 120/80  03/21/18 130/80  04/11/17 (!) 154/88   Guideline developer:  UpToDate (see UpToDate for funding source) Date Released: June 2014  Health Maintenance Due  Topic Date Due  . PNEUMOCOCCAL POLYSACCHARIDE VACCINE AGE 30-64 HIGH RISK  12/07/1972  .  FOOT EXAM  12/07/1980  . OPHTHALMOLOGY EXAM  12/07/1980  . URINE MICROALBUMIN  12/07/1980  . TETANUS/TDAP  12/07/1989  . INFLUENZA VACCINE  12/15/2017    There are no preventive care reminders to display for this patient.  Lab Results  Component Value Date   TSH 2.84 03/22/2018   Lab Results  Component Value Date   WBC 8.3 03/22/2018   HGB 15.3 03/22/2018   HCT 44.8 03/22/2018   MCV 84.9 03/22/2018   PLT 289.0 03/22/2018   Lab Results  Component Value Date   NA 138 06/22/2018   K 4.1 06/22/2018   CO2 27 06/22/2018   GLUCOSE 142 (H) 06/22/2018   BUN 15 06/22/2018   CREATININE 0.87 06/22/2018   BILITOT 0.7 03/22/2018   ALKPHOS 74 03/22/2018   AST 20 03/22/2018   ALT 34 03/22/2018   PROT 6.5 03/22/2018   ALBUMIN 4.3 03/22/2018   CALCIUM 9.5 06/22/2018   ANIONGAP 11 07/03/2015   GFR 93.84 06/22/2018   Lab Results  Component Value Date   CHOL 148 03/22/2018   Lab Results  Component Value Date   HDL 34.10 (L) 03/22/2018   Lab Results  Component Value Date   LDLCALC 85 03/22/2018   Lab Results  Component Value Date   TRIG 143.0 03/22/2018   Lab Results  Component Value Date   CHOLHDL 4 03/22/2018   Lab Results  Component Value Date   HGBA1C 6.9 (H) 06/22/2018      Assessment & Plan:   Problem List Items Addressed This Visit      Endocrine   Controlled type 2 diabetes mellitus without complication, without long-term current use of insulin  (HCC) - Primary   Relevant Orders   Basic metabolic panel (Completed)   Hemoglobin A1c (Completed)     Nervous and Auditory   Brachial plexus neuropathy   Relevant Orders   Ambulatory referral to Neurology     Other   Elevated LDL cholesterol level   Relevant Orders   LDL cholesterol, direct (Completed)   Vitamin D deficiency   Relevant Medications   Vitamin D, Ergocalciferol, (DRISDOL) 1.25 MG (50000 UT) CAPS capsule   Other Relevant Orders   VITAMIN D 25 Hydroxy (Vit-D Deficiency, Fractures) (Completed)   Lipoma of upper extremity   Hematospermia      Meds ordered this encounter  Medications  . Vitamin D, Ergocalciferol, (DRISDOL) 1.25 MG (50000 UT) CAPS capsule    Sig: Take 1 capsule (50,000 Units total) by mouth every 7 (seven) days.    Dispense:  30 capsule    Refill:  1    Follow-up: Return in about 6 months (around 12/21/2018), or if symptoms worsen or fail to improve.   Patient was given information on lipomas.  If he feels a change in any of them we can send him to a general surgeon for excision.  Asked him to return on to see me as soon as possible with the formation of a pimple in his gluteal cleft.  We will go ahead and check his press prostate at his next physical.

## 2018-06-23 LAB — BASIC METABOLIC PANEL
BUN: 15 mg/dL (ref 6–23)
CO2: 27 mEq/L (ref 19–32)
Calcium: 9.5 mg/dL (ref 8.4–10.5)
Chloride: 102 mEq/L (ref 96–112)
Creatinine, Ser: 0.87 mg/dL (ref 0.40–1.50)
GFR: 93.84 mL/min (ref >60.00)
Glucose, Bld: 142 mg/dL — ABNORMAL HIGH (ref 70–99)
Potassium: 4.1 mEq/L (ref 3.5–5.1)
Sodium: 138 mEq/L (ref 135–145)

## 2018-06-23 LAB — LDL CHOLESTEROL, DIRECT: Direct LDL: 89 mg/dL

## 2018-06-23 LAB — HEMOGLOBIN A1C: Hgb A1c MFr Bld: 6.9 % — ABNORMAL HIGH (ref 4.6–6.5)

## 2018-06-23 LAB — VITAMIN D 25 HYDROXY (VIT D DEFICIENCY, FRACTURES): VITD: 26.75 ng/mL — ABNORMAL LOW (ref 30.00–100.00)

## 2018-06-23 MED ORDER — VITAMIN D (ERGOCALCIFEROL) 1.25 MG (50000 UNIT) PO CAPS: 50000.0000 [IU] | ORAL_CAPSULE | ORAL | 1 refills | Status: DC

## 2018-06-23 NOTE — Addendum Note (Signed)
Addended by: Jon Billings on: 06/23/2018 04:56 PM   Modules accepted: Orders

## 2018-07-18 ENCOUNTER — Encounter: Payer: Self-pay | Admitting: Family Medicine

## 2018-08-31 ENCOUNTER — Telehealth: Payer: Self-pay

## 2018-08-31 NOTE — Telephone Encounter (Signed)
I contacted the pt in regards to his new pt appt scheduled for 4/30. Pt was advised, Due to current COVID 19 pandemic, our office is severely reducing in office visits, in order to minimize the risk to our patients and healthcare providers.   Pt was offered a video visit at an earlier date and declined both.  Pt requested to be rescheduled for a face to face visit in July. Appt was made.

## 2018-09-14 ENCOUNTER — Ambulatory Visit: Payer: BLUE CROSS/BLUE SHIELD | Admitting: Neurology

## 2018-09-18 ENCOUNTER — Other Ambulatory Visit: Payer: Self-pay | Admitting: Family Medicine

## 2018-09-18 DIAGNOSIS — E119 Type 2 diabetes mellitus without complications: Secondary | ICD-10-CM

## 2018-12-11 ENCOUNTER — Ambulatory Visit: Payer: Self-pay | Admitting: Neurology

## 2018-12-21 ENCOUNTER — Ambulatory Visit: Payer: Self-pay | Admitting: Neurology

## 2018-12-21 ENCOUNTER — Ambulatory Visit: Payer: Self-pay | Admitting: Family Medicine

## 2018-12-28 ENCOUNTER — Ambulatory Visit: Payer: BC Managed Care – PPO | Admitting: Family Medicine

## 2018-12-28 ENCOUNTER — Other Ambulatory Visit: Payer: Self-pay

## 2018-12-28 ENCOUNTER — Encounter: Payer: Self-pay | Admitting: Family Medicine

## 2018-12-28 VITALS — BP 120/70 | HR 85 | Ht 70.0 in | Wt 304.1 lb

## 2018-12-28 DIAGNOSIS — E559 Vitamin D deficiency, unspecified: Secondary | ICD-10-CM | POA: Diagnosis not present

## 2018-12-28 DIAGNOSIS — E7800 Pure hypercholesterolemia, unspecified: Secondary | ICD-10-CM | POA: Diagnosis not present

## 2018-12-28 DIAGNOSIS — E119 Type 2 diabetes mellitus without complications: Secondary | ICD-10-CM | POA: Diagnosis not present

## 2018-12-28 MED ORDER — METFORMIN HCL ER 500 MG PO TB24: 500.0000 mg | ORAL_TABLET | Freq: Every day | ORAL | 1 refills | Status: DC

## 2018-12-28 MED ORDER — SIMVASTATIN 20 MG PO TABS: 20.0000 mg | ORAL_TABLET | Freq: Every day | ORAL | 3 refills | Status: DC

## 2018-12-28 NOTE — Progress Notes (Signed)
Established Patient Office Visit  Subjective:  Patient ID: Jason Waters, male    DOB: [birth year]  Age: 48 y.o. MRN: [id number]  CC:  Chief Complaint  Patient presents with  . Follow-up    HPI Jason Waters presents for follow-up of his diabetes and elevated cholesterol.  Patient is tolerating the medicines well.  He has not had an eye check in a few years.  Was unable to see the dentist this year.  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  No regular exercise past his strenuous job.  He is taking high-dose vitamin D.  He does have a lot of sun exposure.  Past Medical History:  Diagnosis Date  . Medical history non-contributory     Past Surgical History:  Procedure Laterality Date  . HAND SURGERY Left   . LUMBAR LAMINECTOMY/DECOMPRESSION MICRODISCECTOMY Right 07/09/2015   Procedure: RIGHT SIDED LUMBAR 5-SACRUM 1 MICRODISECTOMY;  Surgeon: Phylliss Bob, MD;  Location: [facility];  Service: Orthopedics;  Laterality: Right;  Right sided lumbar 5-sacrum 1 microdisectomy  . WISDOM TOOTH EXTRACTION      Family History  Problem Relation Age of Onset  . Diabetes Father   . Heart disease Father     Social History   Socioeconomic History  . Marital status: Married    Spouse name: Not on file  . Number of children: Not on file  . Years of education: Not on file  . Highest education level: Not on file  Occupational History  . Not on file  Social Needs  . Financial resource strain: Not on file  . Food insecurity    Worry: Not on file    Inability: Not on file  . Transportation needs    Medical: Not on file    Non-medical: Not on file  Tobacco Use  . Smoking status: Never Smoker  . Smokeless tobacco: Never Used  Substance and Sexual Activity  . Alcohol use: Yes    Comment: rarely  . Drug use: No  . Sexual activity: Yes    Birth control/protection: None  Lifestyle  . Physical activity    Days per week: Not on file    Minutes per session: Not on file  . Stress:  Not on file  Relationships  . Social Herbalist on phone: Not on file    Gets together: Not on file    Attends religious service: Not on file    Active member of club or organization: Not on file    Attends meetings of clubs or organizations: Not on file    Relationship status: Not on file  . Intimate partner violence    Fear of current or ex partner: Not on file    Emotionally abused: Not on file    Physically abused: Not on file    Forced sexual activity: Not on file  Other Topics Concern  . Not on file  Social History Narrative  . Not on file    Outpatient Medications Prior to Visit  Medication Sig Dispense Refill  . Vitamin D, Ergocalciferol, (DRISDOL) 1.25 MG (50000 UT) CAPS capsule Take 1 capsule (50,000 Units total) by mouth every 7 (seven) days. 30 capsule 1  . metFORMIN (GLUCOPHAGE-XR) 500 MG 24 hr tablet TAKE 1 TABLET (500 MG TOTAL) BY MOUTH AT BEDTIME. 90 tablet 1  . simvastatin (ZOCOR) 20 MG tablet Take 1 tablet (20 mg total) by mouth at bedtime. 90 tablet 3   No facility-administered medications prior  to visit.     Allergies  Allergen Reactions  . Bee Venom     ROS Review of Systems  Constitutional: Negative.   Eyes: Negative for photophobia and visual disturbance.  Respiratory: Negative.   Cardiovascular: Negative.   Gastrointestinal: Negative.   Endocrine: Negative for polyphagia and polyuria.  Genitourinary: Negative for difficulty urinating, frequency and urgency.  Musculoskeletal: Negative for gait problem and joint swelling.  Skin: Negative for pallor.  Allergic/Immunologic: Negative for immunocompromised state.  Neurological: Negative for light-headedness and headaches.  Hematological: Does not bruise/bleed easily.  Psychiatric/Behavioral: Negative.       Objective:    Physical Exam  Constitutional: He is oriented to person, place, and time. He appears well-developed and well-nourished. No distress.  HENT:  Head: Normocephalic and  atraumatic.  Right Ear: External ear normal.  Left Ear: External ear normal.  Eyes: Pupils are equal, round, and reactive to light. Conjunctivae are normal. Right eye exhibits no discharge. Left eye exhibits no discharge. No scleral icterus.  Neck: Neck supple. No JVD present. No tracheal deviation present. No thyromegaly present.  Cardiovascular: Normal rate, regular rhythm and normal heart sounds.  Pulmonary/Chest: Effort normal and breath sounds normal. No stridor.  Abdominal: Bowel sounds are normal.  Musculoskeletal:        General: No edema.  Lymphadenopathy:    He has no cervical adenopathy.  Neurological: He is alert and oriented to person, place, and time.  Skin: Skin is warm and dry. He is not diaphoretic.  Psychiatric: He has a normal mood and affect. His behavior is normal.   Diabetic Foot Exam - Simple   Simple Foot Form Diabetic Foot exam was performed with the following findings: Yes 12/28/2018  4:22 PM  Visual Inspection See comments: Yes Sensation Testing Intact to touch and monofilament testing bilaterally: Yes Pulse Check Posterior Tibialis and Dorsalis pulse intact bilaterally: Yes Comments Feet are cavus.  The nails of the great toes are discolored.    BP 120/70   Pulse 85   Ht 5\' 10"  (1.778 m)   Wt (!) 304 lb 2 oz (138 kg)   SpO2 96%   BMI 43.64 kg/m  Wt Readings from Last 3 Encounters:  12/28/18 (!) 304 lb 2 oz (138 kg)  06/22/18 (!) 309 lb (140.2 kg)  03/21/18 (!) 302 lb (137 kg)   BP Readings from Last 3 Encounters:  12/28/18 120/70  06/22/18 120/80  03/21/18 130/80   Guideline developer:  UpToDate (see UpToDate for funding source) Date Released: June 2014  Health Maintenance Due  Topic Date Due  . PNEUMOCOCCAL POLYSACCHARIDE VACCINE AGE 81-64 HIGH RISK  12/07/1972  . FOOT EXAM  12/07/1980  . OPHTHALMOLOGY EXAM  12/07/1980  . URINE MICROALBUMIN  12/07/1980  . TETANUS/TDAP  12/07/1989  . INFLUENZA VACCINE  12/16/2018  . HEMOGLOBIN A1C   12/21/2018    There are no preventive care reminders to display for this patient.  Lab Results  Component Value Date   TSH 2.84 03/22/2018   Lab Results  Component Value Date   WBC 8.3 03/22/2018   HGB 15.3 03/22/2018   HCT 44.8 03/22/2018   MCV 84.9 03/22/2018   PLT 289.0 03/22/2018   Lab Results  Component Value Date   NA 138 06/22/2018   K 4.1 06/22/2018   CO2 27 06/22/2018   GLUCOSE 142 (H) 06/22/2018   BUN 15 06/22/2018   CREATININE 0.87 06/22/2018   BILITOT 0.7 03/22/2018   ALKPHOS 74 03/22/2018   AST 20  03/22/2018   ALT 34 03/22/2018   PROT 6.5 03/22/2018   ALBUMIN 4.3 03/22/2018   CALCIUM 9.5 06/22/2018   ANIONGAP 11 07/03/2015   GFR 93.84 06/22/2018   Lab Results  Component Value Date   CHOL 148 03/22/2018   Lab Results  Component Value Date   HDL 34.10 (L) 03/22/2018   Lab Results  Component Value Date   LDLCALC 85 03/22/2018   Lab Results  Component Value Date   TRIG 143.0 03/22/2018   Lab Results  Component Value Date   CHOLHDL 4 03/22/2018   Lab Results  Component Value Date   HGBA1C 6.9 (H) 06/22/2018      Assessment & Plan:   Problem List Items Addressed This Visit      Endocrine   Controlled type 2 diabetes mellitus without complication, without long-term current use of insulin (HCC) - Primary   Relevant Medications   metFORMIN (GLUCOPHAGE-XR) 500 MG 24 hr tablet   simvastatin (ZOCOR) 20 MG tablet   Other Relevant Orders   CBC   Comprehensive metabolic panel   Hemoglobin A1c   Urinalysis, Routine w reflex microscopic   Microalbumin / creatinine urine ratio     Other   Elevated LDL cholesterol level   Relevant Medications   simvastatin (ZOCOR) 20 MG tablet   Other Relevant Orders   LDL cholesterol, direct   Vitamin D deficiency   Relevant Orders   VITAMIN D 25 Hydroxy (Vit-D Deficiency, Fractures)      Meds ordered this encounter  Medications  . metFORMIN (GLUCOPHAGE-XR) 500 MG 24 hr tablet    Sig: Take 1  tablet (500 mg total) by mouth at bedtime.    Dispense:  90 tablet    Refill:  1  . simvastatin (ZOCOR) 20 MG tablet    Sig: Take 1 tablet (20 mg total) by mouth at bedtime.    Dispense:  90 tablet    Refill:  3    Follow-up: Return in about 6 months (around 06/30/2019).   Discussed the importance of regular eye exams to check for diabetic retinopathy.  Discussed the importance of taking a statin with diabetes.  Offered referral for nutrition therapy and patient declines at this time.  Admits to between meal snacking.  We discussed not snacking between meals is a great way to lose some weight.  Was given information on the Mediterranean diet and living with diabetes.

## 2018-12-28 NOTE — Patient Instructions (Signed)
Living With Diabetes Diabetes (type 1 diabetes mellitus or type 2 diabetes mellitus) is a condition in which the body does not have enough of a hormone called insulin, or the body does not respond properly to insulin. Normally, insulin allows sugars (glucose) to enter cells in the body. The cells use glucose for energy. With diabetes, extra glucose builds up in the blood instead of going into cells, which results in high blood glucose (hyperglycemia). How to manage lifestyle changes Managing diabetes includes medical treatments as well as lifestyle changes. If diabetes is not managed well, serious physical and emotional complications can occur. Taking good care of yourself means that you are responsible for:  Monitoring glucose regularly.  Eating a healthy diet.  Exercising regularly.  Meeting with health care providers.  Taking medicines as directed. Some people may feel a lot of stress about managing their diabetes. This is known as emotional distress, and it is very common. Living with diabetes can place you at risk for emotional distress, depression, or anxiety. These disorders can be confusing and can make diabetes management more difficult. How to recognize stress Emotional distress Symptoms of emotional distress include:  Anger about having a diagnosis of diabetes.  Fear or frustration about your diagnosis and the changes you need to make to manage the condition.  Being overly worried about the care that you need or the cost of the care that you need.  Feeling like you caused your condition by doing something wrong.  Fear of unpredictable situations, like low or high blood glucose.  Feeling judged by your health care providers.  Feeling very alone with the disease.  Getting too tired or worn out with the demands of daily care. Depression Having diabetes means that you are at a higher risk for depression. Having depression also means that you are at a higher risk for  diabetes. Your health care provider may test (screen) you for symptoms of depression. It is important to recognize depression symptoms and to start treatment for depression soon after it is diagnosed. The following are some symptoms of depression:  Loss of interest in things that you used to enjoy.  Trouble sleeping, or often waking up early and not being able to get back to sleep.  A change in appetite.  Feeling tired most of the day.  Feeling nervous and anxious.  Feeling guilty and worrying that you are a burden to others.  Feeling depressed more often than you do not feel that way.  Thoughts of hurting yourself or feeling that you want to die. If you have any of these symptoms for 2 weeks or longer, reach out to a health care provider. Follow these instructions at home: Managing emotional distress The following are some ways to manage emotional distress:  Talk with your health care provider or certified diabetes educator. Consider working with a counselor or therapist.  Learn as much as you can about diabetes and its treatment. Meet with a certified diabetes educator or take a class to learn how to manage your condition.  Keep a journal of your thoughts and concerns.  Accept that some things are out of your control.  Talk with other people who have diabetes. It can help to talk with others about the emotional distress that you feel.  Find ways to manage stress that work for you. These may include art or music therapy, exercise, meditation, and hobbies.  Seek support from spiritual leaders, family, and friends. General instructions  Follow your diabetes management plan.    Keep all follow-up visits as told by your health care provider. This is important. Where to find support   Ask your health care provider to recommend a therapist who understands both depression and diabetes.  Search for information and support from the American Diabetes Association: www.diabetes.org   Find a certified diabetes educator and make an appointment through [place] [facility] Diabetes Educators: www.diabeteseducator.org Get help right away if:  You have thoughts about hurting yourself or others. If you ever feel like you may hurt yourself or others, or have thoughts about taking your own life, get help right away. You can go to your nearest emergency department or call:  Your local emergency services (911 in the [place]  A suicide crisis helpline, such as the [place] at (754)664-5217. This is open 24 hours a day. Summary  Diabetes (type 1 diabetes mellitus or type 2 diabetes mellitus) is a condition in which the body does not have enough of a hormone called insulin, or the body does not respond properly to insulin.  Living with diabetes puts you at risk for medical issues, and it also puts you at risk for emotional issues such as emotional distress, depression, and anxiety.  Recognizing the symptoms of emotional distress and depression may help you avoid problems with your diabetes control. It is important to start treatment for emotional distress and depression soon after they are diagnosed.  Having diabetes means that you are at a higher risk for depression. Ask your health care provider to recommend a therapist who understands both depression and diabetes.  If you experience symptoms of emotional distress or depression, it is important to discuss this with your health care provider, certified diabetes educator, or therapist. This information is not intended to replace advice given to you by your health care provider. Make sure you discuss any questions you have with your health care provider. Document Released: 09/16/2016 Document Revised: 05/15/2018 Document Reviewed: 09/16/2016 Elsevier Patient Education  [place] refers to food and lifestyle choices that are based on the traditions of  countries located on the [employer]. This way of eating has been shown to help prevent certain conditions and improve outcomes for people who have chronic diseases, like kidney disease and heart disease. What are tips for following this plan? Lifestyle  Cook and eat meals together with your family, when possible.  Drink enough fluid to keep your urine clear or pale yellow.  Be physically active every day. This includes: ? Aerobic exercise like running or swimming. ? Leisure activities like gardening, walking, or housework.  Get 7-8 hours of sleep each night.  If recommended by your health care provider, drink red wine in moderation. This means 1 glass a day for nonpregnant women and 2 glasses a day for men. A glass of wine equals 5 oz (150 mL). Reading food labels   Check the serving size of packaged foods. For foods such as rice and pasta, the serving size refers to the amount of cooked product, not dry.  Check the total fat in packaged foods. Avoid foods that have saturated fat or trans fats.  Check the ingredients list for added sugars, such as corn syrup. Shopping  At the grocery store, buy most of your food from the areas near the walls of the store. This includes: ? Fresh fruits and vegetables (produce). ? Grains, beans, nuts, and seeds. Some of these may be available in unpackaged forms or large amounts (in  bulk). ? Fresh seafood. ? Poultry and eggs. ? Low-fat dairy products.  Buy whole ingredients instead of prepackaged foods.  Buy fresh fruits and vegetables in-season from local farmers markets.  Buy frozen fruits and vegetables in resealable bags.  If you do not have access to quality fresh seafood, buy precooked frozen shrimp or canned fish, such as tuna, salmon, or sardines.  Buy small amounts of raw or cooked vegetables, salads, or olives from the deli or salad bar at your store.  Stock your pantry so you always have certain foods on hand, such as olive  oil, canned tuna, canned tomatoes, rice, pasta, and beans. Cooking  Cook foods with extra-virgin olive oil instead of using butter or other vegetable oils.  Have meat as a side dish, and have vegetables or grains as your main dish. This means having meat in small portions or adding small amounts of meat to foods like pasta or stew.  Use beans or vegetables instead of meat in common dishes like chili or lasagna.  Experiment with different cooking methods. Try roasting or broiling vegetables instead of steaming or sauteing them.  Add frozen vegetables to soups, stews, pasta, or rice.  Add nuts or seeds for added healthy fat at each meal. You can add these to yogurt, salads, or vegetable dishes.  Marinate fish or vegetables using olive oil, lemon juice, garlic, and fresh herbs. Meal planning   Plan to eat 1 vegetarian meal one day each week. Try to work up to 2 vegetarian meals, if possible.  Eat seafood 2 or more times a week.  Have healthy snacks readily available, such as: ? Vegetable sticks with hummus. ? [place] yogurt. ? Fruit and nut trail mix.  Eat balanced meals throughout the week. This includes: ? Fruit: 2-3 servings a day ? Vegetables: 4-5 servings a day ? Low-fat dairy: 2 servings a day ? Fish, poultry, or lean meat: 1 serving a day ? Beans and legumes: 2 or more servings a week ? Nuts and seeds: 1-2 servings a day ? Whole grains: 6-8 servings a day ? Extra-virgin olive oil: 3-4 servings a day  Limit red meat and sweets to only a few servings a month What are my food choices?  Mediterranean diet ? Recommended  Grains: Whole-grain pasta. Brown rice. Bulgar wheat. Polenta. Couscous. Whole-wheat bread. [place].  Vegetables: Artichokes. Beets. Broccoli. Cabbage. Carrots. Eggplant. Green beans. Chard. Kale. Spinach. Onions. Leeks. Peas. Squash. Tomatoes. Peppers. Radishes.  Fruits: Apples. Apricots. Avocado. Berries. Bananas. Cherries. Dates. Figs. Grapes.  Lemons. Melon. Oranges. Peaches. Plums. Pomegranate.  Meats and other protein foods: Beans. Almonds. Sunflower seeds. Pine nuts. Peanuts. [place]. Salmon. Scallops. Shrimp. Veneta. Tilapia. Clams. Oysters. Eggs.  Dairy: Low-fat milk. Cheese. Greek yogurt.  Beverages: Water. Red wine. Herbal tea.  Fats and oils: Extra virgin olive oil. Avocado oil. Grape seed oil.  Sweets and desserts: [place] yogurt with honey. Baked apples. Poached pears. Trail mix.  Seasoning and other foods: Basil. Cilantro. Coriander. Cumin. Mint. Parsley. Sage. Rosemary. Tarragon. Garlic. Oregano. Thyme. Pepper. Balsalmic vinegar. Tahini. Hummus. Tomato sauce. Olives. Mushrooms. ? Limit these  Grains: Prepackaged pasta or rice dishes. Prepackaged cereal with added sugar.  Vegetables: Deep fried potatoes (french fries).  Fruits: Fruit canned in syrup.  Meats and other protein foods: Beef. Pork. Lamb. Poultry with skin. Hot dogs. [place] Salines.  Dairy: Ice cream. Sour cream. Whole milk.  Beverages: Juice. Sugar-sweetened soft drinks. Beer. Liquor and spirits.  Fats and oils: Butter. Canola oil. Vegetable oil. Beef  fat (tallow). Lard.  Sweets and desserts: Cookies. Cakes. Pies. Candy.  Seasoning and other foods: Mayonnaise. Premade sauces and marinades. The items listed may not be a complete list. Talk with your dietitian about what dietary choices are right for you. Summary  The Mediterranean diet includes both food and lifestyle choices.  Eat a variety of fresh fruits and vegetables, beans, nuts, seeds, and whole grains.  Limit the amount of red meat and sweets that you eat.  Talk with your health care provider about whether it is safe for you to drink red wine in moderation. This means 1 glass a day for nonpregnant women and 2 glasses a day for men. A glass of wine equals 5 oz (150 mL). This information is not intended to replace advice given to you by your health care provider. Make sure you discuss any questions you  have with your health care provider. Document Released: 12/25/2015 Document Revised: 01/01/2016 Document Reviewed: 12/25/2015 Elsevier Patient Education  2020 [employer].

## 2018-12-29 LAB — COMPREHENSIVE METABOLIC PANEL
ALT: 48 U/L (ref 0–53)
AST: 32 U/L (ref 0–37)
Albumin: 4.6 g/dL (ref 3.5–5.2)
Alkaline Phosphatase: 73 U/L (ref 39–117)
BUN: 21 mg/dL (ref 6–23)
CO2: 26 mEq/L (ref 19–32)
Calcium: 9.8 mg/dL (ref 8.4–10.5)
Chloride: 103 mEq/L (ref 96–112)
Creatinine, Ser: 1.04 mg/dL (ref 0.40–1.50)
GFR: 76.2 mL/min (ref >60.00)
Glucose, Bld: 121 mg/dL — ABNORMAL HIGH (ref 70–99)
Potassium: 3.9 mEq/L (ref 3.5–5.1)
Sodium: 138 mEq/L (ref 135–145)
Total Bilirubin: 0.7 mg/dL (ref 0.2–1.2)
Total Protein: 7 g/dL (ref 6.0–8.3)

## 2018-12-29 LAB — URINALYSIS, ROUTINE W REFLEX MICROSCOPIC
Hgb urine dipstick: NEGATIVE
Ketones, ur: NEGATIVE
Leukocytes,Ua: NEGATIVE
Nitrite: NEGATIVE
RBC / HPF: NONE SEEN (ref 0–?)
Specific Gravity, Urine: >=1.03 — AB (ref 1.000–1.030)
Total Protein, Urine: 30 — AB
Urine Glucose: NEGATIVE
Urobilinogen, UA: 0.2 (ref 0.0–1.0)
WBC, UA: NONE SEEN (ref 0–?)
pH: 6 (ref 5.0–8.0)

## 2018-12-29 LAB — CBC
HCT: 44.1 % (ref 39.0–52.0)
Hemoglobin: 14.8 g/dL (ref 13.0–17.0)
MCHC: 33.6 g/dL (ref 30.0–36.0)
MCV: 87 fl (ref 78.0–100.0)
Platelets: 289 10*3/uL (ref 150.0–400.0)
RBC: 5.08 Mil/uL (ref 4.22–5.81)
RDW: 13.6 % (ref 11.5–15.5)
WBC: 10.3 10*3/uL (ref 4.0–10.5)

## 2018-12-29 LAB — MICROALBUMIN / CREATININE URINE RATIO
Creatinine,U: 370 mg/dL
Microalb Creat Ratio: 6.4 mg/g (ref 0.0–30.0)
Microalb, Ur: 23.8 mg/dL — ABNORMAL HIGH (ref 0.0–1.9)

## 2018-12-29 LAB — HEMOGLOBIN A1C: Hgb A1c MFr Bld: 7 % — ABNORMAL HIGH (ref 4.6–6.5)

## 2018-12-29 LAB — LDL CHOLESTEROL, DIRECT: Direct LDL: 97 mg/dL

## 2018-12-29 LAB — VITAMIN D 25 HYDROXY (VIT D DEFICIENCY, FRACTURES): VITD: 37.72 ng/mL (ref 30.00–100.00)

## 2019-01-20 DIAGNOSIS — L0390 Cellulitis, unspecified: Secondary | ICD-10-CM | POA: Diagnosis not present

## 2019-01-23 ENCOUNTER — Encounter: Payer: Self-pay | Admitting: Family Medicine

## 2019-01-23 ENCOUNTER — Other Ambulatory Visit: Payer: Self-pay

## 2019-01-23 ENCOUNTER — Ambulatory Visit: Payer: BC Managed Care – PPO | Admitting: Family Medicine

## 2019-01-23 VITALS — BP 122/64 | HR 74 | Temp 98.1°F | Wt 306.4 lb

## 2019-01-23 DIAGNOSIS — K610 Anal abscess: Secondary | ICD-10-CM | POA: Diagnosis not present

## 2019-01-23 DIAGNOSIS — K6289 Other specified diseases of anus and rectum: Secondary | ICD-10-CM | POA: Diagnosis not present

## 2019-01-23 DIAGNOSIS — L290 Pruritus ani: Secondary | ICD-10-CM | POA: Insufficient documentation

## 2019-01-23 NOTE — Progress Notes (Signed)
Jason Waters - 48 y.o. male MRN [id number]  Date of birth: 04-Oct-1970  Subjective Chief Complaint  Patient presents with  . Follow-up    Patient is here today to F/U after UC for rectal bleeding. He declines immunizations. He needs referral to general surgeon and requests it urgently as he cannot work like this.    HPI Jason Waters is a 48 y.o. male wit history of T2DM here today with complaint for anorectal pain. Symptoms started 1 week ago.  He noticed a protrusion with swelling and tenderness around the anus at that time.  Symptoms worsened as the week went on.  He was seen at urgent care over the weekend and told there may be an abscess and/or fistula and recommend that he contact he PCP for referral to general surgery.  He was also started on bactrim as well which he has been taking as directed.  He states that area did "bust" and he had bloody drainage mixed with pus.  He continues to have pain and swelling in this area.  He denies fever, chills, or increased fatigue.  He denies changes to bowel movements.   ROS:  A comprehensive ROS was completed and negative except as noted per HPI  Allergies  Allergen Reactions  . Bee Venom     Past Medical History:  Diagnosis Date  . Medical history non-contributory     Past Surgical History:  Procedure Laterality Date  . HAND SURGERY Left   . LUMBAR LAMINECTOMY/DECOMPRESSION MICRODISCECTOMY Right 07/09/2015   Procedure: RIGHT SIDED LUMBAR 5-SACRUM 1 MICRODISECTOMY;  Surgeon: Phylliss Bob, MD;  Location: [facility];  Service: Orthopedics;  Laterality: Right;  Right sided lumbar 5-sacrum 1 microdisectomy  . WISDOM TOOTH EXTRACTION      Social History   Socioeconomic History  . Marital status: Married    Spouse name: Not on file  . Number of children: Not on file  . Years of education: Not on file  . Highest education level: Not on file  Occupational History  . Not on file  Social Needs  . Financial resource strain: Not on file   . Food insecurity    Worry: Not on file    Inability: Not on file  . Transportation needs    Medical: Not on file    Non-medical: Not on file  Tobacco Use  . Smoking status: Never Smoker  . Smokeless tobacco: Never Used  Substance and Sexual Activity  . Alcohol use: Yes    Comment: rarely  . Drug use: No  . Sexual activity: Yes    Birth control/protection: None  Lifestyle  . Physical activity    Days per week: Not on file    Minutes per session: Not on file  . Stress: Not on file  Relationships  . Social Herbalist on phone: Not on file    Gets together: Not on file    Attends religious service: Not on file    Active member of club or organization: Not on file    Attends meetings of clubs or organizations: Not on file    Relationship status: Not on file  Other Topics Concern  . Not on file  Social History Narrative  . Not on file    Family History  Problem Relation Age of Onset  . Diabetes Father   . Heart disease Father     Health Maintenance  Topic Date Due  . OPHTHALMOLOGY EXAM  12/07/1980  . INFLUENZA VACCINE  08/15/2019 (Originally 12/16/2018)  . PNEUMOCOCCAL POLYSACCHARIDE VACCINE AGE 71-64 HIGH RISK  01/23/2020 (Originally 12/07/1972)  . TETANUS/TDAP  01/23/2020 (Originally 12/07/1989)  . HEMOGLOBIN A1C  06/30/2019  . FOOT EXAM  12/28/2019  . URINE MICROALBUMIN  12/28/2019  . HIV Screening  Completed    ----------------------------------------------------------------------------------------------------------------------------------------------------------------------------------------------------------------- Physical Exam BP 122/64 (BP Location: Right Arm, Patient Position: Sitting, Cuff Size: Large)   Pulse 74   Temp 98.1 F (36.7 C) (Oral)   Wt (!) 306 lb 6.4 oz (139 kg)   SpO2 98%   BMI 43.96 kg/m   Physical Exam Constitutional:      Appearance: Normal appearance.  Cardiovascular:     Rate and Rhythm: Normal rate and regular rhythm.   Pulmonary:     Effort: Pulmonary effort is normal.     Breath sounds: Normal breath sounds.  Abdominal:     General: Abdomen is flat. There is no distension.     Tenderness: There is no abdominal tenderness.  Genitourinary:    Comments: Tenderness and swelling around perianal area, at ~6 o'clock position.  There is area that appears to be spontaneously draining however there appears to be another pocket adjacent to area with some surrounding cellulitis.  He refuses rectal exam due to pain.  Skin:    General: Skin is warm and dry.  Neurological:     General: No focal deficit present.     Mental Status: He is alert.  Psychiatric:        Mood and Affect: Mood normal.        Behavior: Behavior normal.     ------------------------------------------------------------------------------------------------------------------------------------------------------------------------------------------------------------------- Assessment and Plan  Perianal abscess -Recommend urgent referral to general surgery.  Has had some spontaneous drainage however concerned for secondary pocket as adjacent to abscess as well that may require further I&D.   Risk factors include diabetes.   -He will continue bactrim for now as well.  -Scheduled for 330pm with general surgery today.   >25 minutes spent with patient with 50% of time spent providing counseling and/or coordination of care as outlined above.

## 2019-01-23 NOTE — Patient Instructions (Signed)
You have an appointment with general surgery at [facility], [place], [place] [address]. Phone: [phone].   Please arrive by 3:30 PM for this appointment

## 2019-01-23 NOTE — Assessment & Plan Note (Addendum)
-  Recommend urgent referral to general surgery.  Has had some spontaneous drainage however concerned for secondary pocket as adjacent to abscess as well that may require further I&D.   Risk factors include diabetes.   -He will continue bactrim for now as well.  -Scheduled for 330pm with general surgery today.

## 2019-01-30 ENCOUNTER — Ambulatory Visit: Payer: Self-pay | Admitting: Surgery

## 2019-01-30 DIAGNOSIS — M6208 Separation of muscle (nontraumatic), other site: Secondary | ICD-10-CM | POA: Diagnosis not present

## 2019-01-30 DIAGNOSIS — K603 Anal fistula: Secondary | ICD-10-CM | POA: Diagnosis not present

## 2019-01-30 NOTE — H&P (Signed)
Jason Waters Documented: 01/30/2019 10:18 AM Location: [facility] Patient #: [id number] DOB: Jul 01, 1970 Married / Language: Jason Waters / Race: White Male  History of Present Illness Jason Hector MD; 01/30/2019 10:57 AM) The patient is a 48 year old male who presents with anal fistula. Note for "Anal fistula": ` ` ` Patient sent for surgical consultation at the request of Jason Waters  Chief Complaint: Recurrent anal abscesses. Possible fistula ` ` The patient is a pleasant obese male who is struggled with boils around his anus for the past decade. A painful lump. Usually they will pop on its own or he pops it. Dries up. However he feels like it keeps coming back. Has not a point warts happening about every 2 months. Last episode was rather painful. Size primary care physician. Placed on Bactrim. Sent to our office. By the time he saw [place] he was starting to drain. He completed his Bactrim antibiotics. Because of the history of recurrent abscesses, fistula suspected. Referred for evaluation.  Patient moves his bowels about twice a day. He does not smoke. He has borderline diabetes currently on metformin only. He can walk 30 minutes without difficulty. No cardiac or pulmonary issues. He does have a few lumps on his forearms and but told there lipomas. No skin infections or other infections. No MRSA. He thinks his mother was diagnosed with some type of colitis at some point but does not recall being diagnosed with ulcerative colitis. No personal nor family history of GI/colon cancer, inflammatory bowel disease, irritable bowel syndrome, allergy such as Celiac Sprue, dietary/dairy problems, colitis, ulcers nor gastritis. No recent sick contacts/gastroenteritis. No travel outside the country. No changes in diet. No dysphagia to solids or liquids. No significant heartburn or reflux. No hematochezia, hematemesis, coffee ground emesis. No evidence of prior  gastric/peptic ulceration.  (Review of systems as stated in this history (HPI) or in the review of systems. Otherwise all other 12 point ROS are negative) ` ` `   Past Surgical History [name], CMA; 01/30/2019 10:18 AM) Oral Waters Spinal Waters - Lower Back  Diagnostic Studies History [name], CMA; 01/30/2019 10:18 AM) Colonoscopy never  Allergies ([name], CMA; 01/30/2019 10:19 AM) No Known Allergies [01/30/2019]: No Known Drug Allergies [07/16/2015]: Allergies Reconciled  Medication History ([name], CMA; 01/30/2019 10:20 AM) metFORMIN HCl ER (500MG  Tablet ER 24HR, Oral) Active. Vitamin D (Ergocalciferol) (1.25 MG(50000 UT) Capsule, Oral) Active. Simvastatin (20MG  Tablet, Oral) Active. Ondansetron HCl (8MG  Tablet, Oral) Active. metFORMIN HCl (500MG  Tablet, Oral) Active. Medications Reconciled  Other Problems [name], CMA; 01/30/2019 10:18 AM) Diabetes Mellitus Hypercholesterolemia     Review of Systems ([name] CMA; 01/30/2019 10:18 AM) General Not Present- Appetite Loss, Chills, Fatigue, Fever, Night Sweats, Weight Gain and Weight Loss. Skin Not Present- Change in Wart/Mole, Dryness, Hives, Jaundice, New Lesions, Non-Healing Wounds, Rash and Ulcer. HEENT Not Present- Earache, Hearing Loss, Hoarseness, Nose Bleed, Oral Ulcers, Ringing in the Ears, Seasonal Allergies, Sinus Pain, Sore Throat, Visual Disturbances, Wears glasses/contact lenses and Yellow Eyes. Respiratory Not Present- Bloody sputum, Chronic Cough, Difficulty Breathing, Snoring and Wheezing. Cardiovascular Not Present- Chest Pain, Difficulty Breathing Lying Down, Leg Cramps, Palpitations, Rapid Heart Rate, Shortness of Breath and Swelling of Extremities. Gastrointestinal Present- Rectal Pain. Not Present- Abdominal Pain, Bloating, Bloody Stool, Change in Bowel Habits, Chronic diarrhea, Constipation, Difficulty Swallowing, Excessive gas, Gets full quickly at  meals, Hemorrhoids, Indigestion, Nausea and Vomiting. Male Genitourinary Not Present- Blood in Urine, Change in Urinary Stream, Frequency,  Impotence, Nocturia, Painful Urination, Urgency and Urine Leakage. Musculoskeletal Not Present- Back Pain, Joint Pain, Joint Stiffness, Muscle Pain, Muscle Weakness and Swelling of Extremities. Neurological Present- Numbness. Not Present- Decreased Memory, Fainting, Headaches, Seizures, Tingling, Tremor, Trouble walking and Weakness. Psychiatric Not Present- Anxiety, Bipolar, Change in Sleep Pattern, Depression, Fearful and Frequent crying. Endocrine Not Present- Cold Intolerance, Excessive Hunger, Hair Changes, Heat Intolerance, Hot flashes and New Diabetes. Hematology Not Present- Blood Thinners, Easy Bruising, Excessive bleeding, Gland problems, HIV and Persistent Infections.  Vitals ([name] CMA; 01/30/2019 10:21 AM) 01/30/2019 10:20 AM Weight: 302.25 lb Height: 70in Body Surface Area: 2.49 m Body Mass Index: 43.37 kg/m  Temp.: 96.60F(Temporal)  Pulse: 97 (Regular)  BP: 132/78 (Sitting, Left Arm, Standard)        Physical Exam Jason Hector MD; 01/30/2019 10:46 AM)  General Mental Status-Alert. General Appearance-Not in acute distress, Not Sickly. Orientation-Oriented X3. Hydration-Well hydrated. Voice-Normal.  Integumentary Global Assessment Upon inspection and palpation of skin surfaces of the - Axillae: non-tender, no inflammation or ulceration, no drainage. and Distribution of scalp and body hair is normal. General Characteristics Temperature - normal warmth is noted.  Head and Neck Head-normocephalic, atraumatic with no lesions or palpable masses. Face Global Assessment - atraumatic, no absence of expression. Neck Global Assessment - no abnormal movements, no bruit auscultated on the right, no bruit auscultated on the left, no decreased range of motion,  non-tender. Trachea-midline. Thyroid Gland Characteristics - non-tender.  Eye Eyeball - Left-Extraocular movements intact, No Nystagmus - Left. Eyeball - Right-Extraocular movements intact, No Nystagmus - Right. Cornea - Left-No Hazy - Left. Cornea - Right-No Hazy - Right. Sclera/Conjunctiva - Left-No scleral icterus, No Discharge - Left. Sclera/Conjunctiva - Right-No scleral icterus, No Discharge - Right. Pupil - Left-Direct reaction to light normal. Pupil - Right-Direct reaction to light normal.  ENMT Ears Pinna - Left - no drainage observed, no generalized tenderness observed. Pinna - Right - no drainage observed, no generalized tenderness observed. Nose and Sinuses External Inspection of the Nose - no destructive lesion observed. Inspection of the nares - Left - quiet respiration. Inspection of the nares - Right - quiet respiration. Mouth and Throat Lips - Upper Lip - no fissures observed, no pallor noted. Lower Lip - no fissures observed, no pallor noted. Nasopharynx - no discharge present. Oral Cavity/Oropharynx - Tongue - no dryness observed. Oral Mucosa - no cyanosis observed. Hypopharynx - no evidence of airway distress observed.  Chest and Lung Exam Inspection Movements - Normal and Symmetrical. Accessory muscles - No use of accessory muscles in breathing. Palpation Palpation of the chest reveals - Non-tender. Auscultation Breath sounds - Normal and Clear.  Cardiovascular Auscultation Rhythm - Regular. Murmurs & Other Heart Sounds - Auscultation of the heart reveals - No Murmurs and No Systolic Clicks.  Abdomen Inspection Inspection of the abdomen reveals - No Visible peristalsis and No Abnormal pulsations. Umbilicus - No Bleeding, No Urine drainage. Palpation/Percussion Palpation and Percussion of the abdomen reveal - Soft, Non Tender, No Rebound tenderness, No Rigidity (guarding) and No Cutaneous hyperesthesia. Note: Abdomen obese but soft. Mild  supraumbilical diastases recti. Nontender. Not distended. No umbilical or incisional hernias. No guarding.  Male Genitourinary Sexual Maturity Tanner 5 - Adult hair pattern and Adult penile size and shape. Note: No inguinal hernias. Normal external genitalia. Epididymi, testes, and spermatic cords normal without any masses.  Rectal Note: Perianal skin clear. Subtle right posterior perianal punctate scar. However no cord. No drainage. about 2.5 cm from anal  verge but no active sinus. No drainage with rectal wall pressure.  Anterior midline perianal region is thickened and inflamed with a 3 mm punctate opening within the centimeter the anal verge. This is the most sensitive tender spot for him. Suspicious of anterior midline fistula.  No anal fissure. No external hemorrhoids. No prolapsing internal hemorrhoids. No active abscess. No pilonidal disease. Normal sphincter tone. Tolerates digital exam. Soft internal hemorrhoids without enlargement. No prolapse. No bleeding. Held off on anoscopy given his discomfort.  Peripheral Vascular Upper Extremity Inspection - Left - No Cyanotic nailbeds - Left, Not Ischemic. Inspection - Right - No Cyanotic nailbeds - Right, Not Ischemic.  Neurologic Neurologic evaluation reveals -normal attention span and ability to concentrate, able to name objects and repeat phrases. Appropriate fund of knowledge , normal sensation and normal coordination. Mental Status Affect - not angry, not paranoid. Cranial Nerves-Normal Bilaterally. Gait-Normal.  Neuropsychiatric Mental status exam performed with findings of-able to articulate well with normal speech/language, rate, volume and coherence, thought content normal with ability to perform basic computations and apply abstract reasoning and no evidence of hallucinations, delusions, obsessions or homicidal/suicidal ideation.  Musculoskeletal Global Assessment Spine, Ribs and Pelvis - no  instability, subluxation or laxity. Right Upper Extremity - no instability, subluxation or laxity.  Lymphatic Head & Neck  General Head & Neck Lymphatics: Bilateral - Description - No Localized lymphadenopathy. Axillary  General Axillary Region: Bilateral - Description - No Localized lymphadenopathy. Femoral & Inguinal  Generalized Femoral & Inguinal Lymphatics: Left - Description - No Localized lymphadenopathy. Right - Description - No Localized lymphadenopathy.    Assessment & Plan Jason Hector MD; 01/30/2019 10:55 AM)  ANAL FISTULA (K60.3) Impression: Recurrent episodes of anterior anal pain and popping abscesses. No evidence of the fistula. Hopefully this is just superficial. However I don't think this will go away without Waters. I recommended an outpatient examination under anesthesia with possible fistulotomy/fistula excision. Possible LIFT repair if intersphincteric. Postoperative pain and goals of recovery and following wound were discussed. He is motivated to proceed with Waters as soon as possible.  Current Plans Pt Education - CCS Abscess/Fistula (AT): discussed with patient and provided information. The anatomy & physiology of the anorectal region was discussed. We discussed the pathophysiology of anorectal abscess and fistula. Differential diagnosis was discussed. Natural history progression was discussed. I stressed the importance of a bowel regimen to have daily soft bowel movements to minimize progression of disease.  The patient's condition is not adequately controlled. Non-operative treatment has not healed the fistula. Therefore, I recommended examination under anaesthesia to confirm the diagnosis and treat the fistula. I discussed techniques that may be required such as fistulotomy, ligation by LIFT technique, and/or seton placement. Benefits & alternatives discussed. I noted a good likelihood this will help address the problem, but sometimes repeat  operations and prolonged healing times may occur. Risks such as bleeding, pain, recurrence, reoperation, incontinence, heart attack, death, and other risks were discussed.  Educational handouts further explaining the pathology, treatment options, and bowel regimen were given. The patient expressed understanding & wishes to proceed. We will work to coordinate Waters for a mutually convenient time.   ENCOUNTER FOR PREOPERATIVE EXAMINATION FOR GENERAL SURGICAL PROCEDURE (Z01.818)  Current Plans You are being scheduled for Waters- Our schedulers will call you.  You should hear from our office's scheduling department within 5 working days about the location, date, and time of Waters. We try to make accommodations for patient's preferences in scheduling Waters, but sometimes the  OR schedule or the surgeon's schedule prevents [place] from making those accommodations.  If you have not heard from our office [phone]) in 5 working days, call the office and ask for your surgeon's nurse.  If you have other questions about your diagnosis, plan, or Waters, call the office and ask for your surgeon's nurse.  Pt Education - CCS Rectal Prep for Anorectal outpatient/office Waters: discussed with patient and provided information. Pt Education - CCS Rectal Waters HCI (Donoven Pett): discussed with patient and provided information. Pt Education - CCS Good Bowel Health (Rachel Rison)  DIASTASIS RECTI (M62.08)  Current Plans Pt Education - CCS Diastasis Recti: discussed with patient and provided information.  Jason Hector, MD, FACS, MASCRS [facility]    [address] [place], [place] [address] [phone] Main / Paging [phone] Fax

## 2019-02-15 HISTORY — PX: POLYPECTOMY: SHX149

## 2019-03-06 NOTE — Patient Instructions (Addendum)
DUE TO COVID-19 ONLY ONE VISITOR IS ALLOWED TO COME WITH YOU AND STAY IN THE WAITING ROOM ONLY DURING PRE OP AND PROCEDURE DAY OF SURGERY. THE 1 VISITOR MAY VISIT WITH YOU AFTER SURGERY IN YOUR PRIVATE ROOM DURING VISITING HOURS ONLY!  YOU NEED TO HAVE A COVID 19 TEST ON_Monday 10/26/2020__ @__3 :15pm_____, THIS TEST MUST BE DONE BEFORE SURGERY, COME  [address] [place] , [id number].  ([place]) ONCE YOUR COVID TEST IS COMPLETED, PLEASE BEGIN THE QUARANTINE INSTRUCTIONS AS OUTLINED IN YOUR HANDOUT.                Jason Waters    Your procedure is scheduled on: Thursday 03/15/2019   Report to [facility] Main  Entrance    Report to Short Stay at 5:30 am     Call this number if you have problems the morning of surgery [phone]    Remember: Please follow your prep, per your surgeon's instruction. Also consume a Clear Liquid Diet the Day of Prep to prevent Dehydration. You will continue this clear liquid diet until 4:30 am the morning of surgery.  After 4:30 AM, nothing until after surgery.    CLEAR LIQUID DIET   Foods Allowed                                                                     Foods Excluded  Coffee and tea, regular and decaf                             liquids that you cannot  Plain Jell-O any favor except red or purple                                           see through such as: Fruit ices (not with fruit pulp)                                     milk, soups, orange juice  Iced Popsicles                                    All solid food Carbonated beverages, regular and diet                                    Cranberry, grape and apple juices Sports drinks like Gatorade Lightly seasoned clear broth or consume(fat free) Sugar, honey syrup  Sample Menu Breakfast                                Lunch                                     Supper Cranberry juice  Beef broth                            Chicken  broth Jell-O                                     Grape juice                           Apple juice Coffee or tea                        Jell-O                                      Popsicle                                                Coffee or tea                        Coffee or tea  _____________________________________________________________________       BRUSH YOUR TEETH MORNING OF SURGERY AND RINSE YOUR MOUTH OUT, NO CHEWING GUM CANDY OR MINTS.     Take these medicines the morning of surgery with A SIP OF WATER: None   DO NOT TAKE ANY DIABETIC MEDICATIONS DAY OF YOUR SURGERY                               You may not have any metal on your body including hair pins and              piercings     Do not wear jewelry, cologne, lotions, powders or deodorant                          Men may shave face and neck.   Do not bring valuables to the hospital. [place].  Contacts, dentures or bridgework may not be worn into surgery.     Patients discharged the day of surgery will not be allowed to drive home. IF YOU ARE HAVING SURGERY AND GOING HOME THE  SAME DAY, YOU MUST HAVE AN ADULT TO DRIVE YOU HOME AND BE WITH YOU FOR 24 HOURS. YOU MAY GO HOME BY TAXI  OR UBER OR ORTHERWISE, BUT AN ADULT MUST ACCOMPANY YOU HOME AND STAY WITH YOU FOR 24 HOURS.    Name and phone number of your driver: Jason Waters [phone]               Please read over the following fact sheets you were given: _____________________________________________________________________  How to Manage Your Diabetes Before and After Surgery  Why is it important to control my blood sugar before and after surgery? . Improving blood sugar levels before and after surgery helps healing and can limit problems. . A way of improving blood sugar control is eating a healthy diet by: o  Eating  less sugar and carbohydrates o  Increasing activity/exercise o  Talking with  your doctor about reaching your blood sugar goals . High blood sugars (greater than 180 mg/dL) can raise your risk of infections and slow your recovery, so you will need to focus on controlling your diabetes during the weeks before surgery. . Make sure that the doctor who takes care of your diabetes knows about your planned surgery including the date and location.  How do I manage my blood sugar before surgery? . Check your blood sugar at least 4 times a day, starting 2 days before surgery, to make sure that the level is not too high or low. o Check your blood sugar the morning of your surgery when you wake up and every 2 hours until you get to the Short Stay unit. . If your blood sugar is less than 70 mg/dL, you will need to treat for low blood sugar: o Do not take insulin. o Treat a low blood sugar (less than 70 mg/dL) with  cup of clear juice (cranberry or apple), 4 glucose tablets, OR glucose gel. o Recheck blood sugar in 15 minutes after treatment (to make sure it is greater than 70 mg/dL). If your blood sugar is not greater than 70 mg/dL on recheck, call [phone] for further instructions. . Report your blood sugar to the short stay nurse when you get to Short Stay.  . If you are admitted to the hospital after surgery: o Your blood sugar will be checked by the staff and you will probably be given insulin after surgery (instead of oral diabetes medicines) to make sure you have good blood sugar levels. o The goal for blood sugar control after surgery is 80-180 mg/dL.   WHAT DO I DO ABOUT MY DIABETES MEDICATION?  Marland Kitchen Do not take oral diabetes medicines (pills) the morning of surgery.  . THE NIGHT BEFORE SURGERY, take metformin as prescribed                  [facility] - Preparing for Surgery Before surgery, you can play an important role.  Because skin is not sterile, your skin needs to be as free of germs as possible.  You can reduce the number of germs on your skin by washing  with CHG (chlorahexidine gluconate) soap before surgery.  CHG is an antiseptic cleaner which kills germs and bonds with the skin to continue killing germs even after washing. Please DO NOT use if you have an allergy to CHG or antibacterial soaps.  If your skin becomes reddened/irritated stop using the CHG and inform your nurse when you arrive at Short Stay. Do not shave (including legs and underarms) for at least 48 hours prior to the first CHG shower.  You may shave your face/neck. Please follow these instructions carefully:  1.  Shower with CHG Soap the night before surgery and the  morning of Surgery.  2.  If you choose to wash your hair, wash your hair first as usual with your  normal  shampoo.  3.  After you shampoo, rinse your hair and body thoroughly to remove the  shampoo.                            4.  Use CHG as you would any other liquid soap.  You can apply chg directly  to the skin and wash  Gently with a scrungie or clean washcloth.  5.  Apply the CHG Soap to your body ONLY FROM THE NECK DOWN.   Do not use on face/ open                           Wound or open sores. Avoid contact with eyes, ears mouth and genitals (private parts).                       Wash face,  Genitals (private parts) with your normal soap.             6.  Wash thoroughly, paying special attention to the area where your surgery  will be performed.  7.  Thoroughly rinse your body with warm water from the neck down.  8.  DO NOT shower/wash with your normal soap after using and rinsing off  the CHG Soap.                9.  Pat yourself dry with a clean towel.            10.  Wear clean pajamas.            11.  Place clean sheets on your bed the night of your first shower and do not  sleep with pets. Day of Surgery : Do not apply any lotions/deodorants the morning of surgery.  Please wear clean clothes to the hospital/surgery center.  FAILURE TO FOLLOW THESE INSTRUCTIONS MAY RESULT IN THE  CANCELLATION OF YOUR SURGERY PATIENT SIGNATURE_________________________________  NURSE SIGNATURE__________________________________  ________________________________________________________________________

## 2019-03-07 ENCOUNTER — Other Ambulatory Visit: Payer: Self-pay

## 2019-03-07 ENCOUNTER — Encounter (HOSPITAL_COMMUNITY): Payer: Self-pay

## 2019-03-07 ENCOUNTER — Encounter (HOSPITAL_COMMUNITY)
Admission: RE | Admit: 2019-03-07 | Discharge: 2019-03-07 | Disposition: A | Discharge status: Home / Self Care | Payer: BC Managed Care – PPO | Source: Ambulatory Visit | Attending: Surgery | Admitting: Surgery

## 2019-03-07 DIAGNOSIS — Z01818 Encounter for other preprocedural examination: Secondary | ICD-10-CM | POA: Insufficient documentation

## 2019-03-07 DIAGNOSIS — E118 Type 2 diabetes mellitus with unspecified complications: Secondary | ICD-10-CM | POA: Insufficient documentation

## 2019-03-07 HISTORY — DX: Type 2 diabetes mellitus without complications: E11.9

## 2019-03-07 LAB — GLUCOSE, CAPILLARY: Glucose-Capillary: 135 mg/dL — ABNORMAL HIGH (ref 70–99)

## 2019-03-07 LAB — BASIC METABOLIC PANEL
Anion gap: 9 (ref 5–15)
BUN: 15 mg/dL (ref 6–20)
CO2: 25 mmol/L (ref 22–32)
Calcium: 9.4 mg/dL (ref 8.9–10.3)
Chloride: 105 mmol/L (ref 98–111)
Creatinine, Ser: 0.84 mg/dL (ref 0.61–1.24)
GFR calc Af Amer: >60 mL/min (ref >60)
GFR calc non Af Amer: >60 mL/min (ref >60)
Glucose, Bld: 115 mg/dL — ABNORMAL HIGH (ref 70–99)
Potassium: 4.1 mmol/L (ref 3.5–5.1)
Sodium: 139 mmol/L (ref 135–145)

## 2019-03-07 LAB — CBC
HCT: 45 % (ref 39.0–52.0)
Hemoglobin: 15 g/dL (ref 13.0–17.0)
MCH: 28.9 pg (ref 26.0–34.0)
MCHC: 33.3 g/dL (ref 30.0–36.0)
MCV: 86.7 fL (ref 80.0–100.0)
Platelets: 296 10*3/uL (ref 150–400)
RBC: 5.19 MIL/uL (ref 4.22–5.81)
RDW: 12.6 % (ref 11.5–15.5)
WBC: 10.3 10*3/uL (ref 4.0–10.5)
nRBC: 0 % (ref 0.0–0.2)

## 2019-03-07 LAB — HEMOGLOBIN A1C
Hgb A1c MFr Bld: 6.9 % — ABNORMAL HIGH (ref 4.8–5.6)
Mean Plasma Glucose: 151.33 mg/dL

## 2019-03-12 ENCOUNTER — Other Ambulatory Visit (HOSPITAL_COMMUNITY)
Admission: RE | Admit: 2019-03-12 | Discharge: 2019-03-12 | Disposition: A | Discharge status: Home / Self Care | Payer: BC Managed Care – PPO | Source: Ambulatory Visit | Attending: Surgery | Admitting: Surgery

## 2019-03-12 DIAGNOSIS — Z20828 Contact with and (suspected) exposure to other viral communicable diseases: Secondary | ICD-10-CM | POA: Insufficient documentation

## 2019-03-12 DIAGNOSIS — Z01812 Encounter for preprocedural laboratory examination: Secondary | ICD-10-CM | POA: Insufficient documentation

## 2019-03-13 LAB — NOVEL CORONAVIRUS, NAA (HOSP ORDER, SEND-OUT TO REF LAB; TAT 18-24 HRS): SARS-CoV-2, NAA: NOT DETECTED

## 2019-03-14 ENCOUNTER — Encounter (HOSPITAL_COMMUNITY): Payer: Self-pay | Admitting: Anesthesiology

## 2019-03-14 MED ORDER — BUPIVACAINE LIPOSOME 1.3 % IJ SUSP
20.0000 mL | Freq: Once | INTRAMUSCULAR | Status: DC
  Filled 2019-03-14: qty 20

## 2019-03-14 NOTE — Anesthesia Preprocedure Evaluation (Addendum)
Anesthesia Evaluation  Patient identified by MRN, date of birth, ID band Patient awake    Reviewed: Allergy & Precautions, NPO status , Patient's Chart, lab work & pertinent test results  Airway Mallampati: III  TM Distance: >3 FB Neck ROM: Full    Dental no notable dental hx. (+) Teeth Intact, Chipped,    Pulmonary neg pulmonary ROS,    Pulmonary exam normal breath sounds clear to auscultation       Cardiovascular negative cardio ROS Normal cardiovascular exam Rhythm:Regular Rate:Normal     Neuro/Psych Hx/o brachial plexus neuropathy  Neuromuscular disease negative psych ROS   GI/Hepatic Neg liver ROS, Perirectal fistula   Endo/Other  diabetes, Poorly Controlled, Type 2Morbid obesityHyperlipidemia  Renal/GU negative Renal ROS  negative genitourinary   Musculoskeletal negative musculoskeletal ROS (+)   Abdominal (+) + obese,   Peds  Hematology negative hematology ROS (+)   Anesthesia Other Findings   Reproductive/Obstetrics                           Anesthesia Physical Anesthesia Plan  ASA: III  Anesthesia Plan: General   Post-op Pain Management:    Induction:   PONV Risk Score and Plan: 3 and Midazolam, Ondansetron and Treatment may vary due to age or medical condition  Airway Management Planned: Oral ETT  Additional Equipment:   Intra-op Plan:   Post-operative Plan: Extubation in OR  Informed Consent: I have reviewed the patients History and Physical, chart, labs and discussed the procedure including the risks, benefits and alternatives for the proposed anesthesia with the patient or authorized representative who has indicated his/her understanding and acceptance.     Dental advisory given  Plan Discussed with: CRNA and Surgeon  Anesthesia Plan Comments:        Anesthesia Quick Evaluation

## 2019-03-15 ENCOUNTER — Ambulatory Visit (HOSPITAL_COMMUNITY)
Admission: RE | Admit: 2019-03-15 | Discharge: 2019-03-15 | Disposition: A | Discharge status: Home / Self Care | Payer: BC Managed Care – PPO | Attending: Surgery | Admitting: Surgery

## 2019-03-15 ENCOUNTER — Encounter (HOSPITAL_COMMUNITY): Payer: Self-pay | Admitting: *Deleted

## 2019-03-15 ENCOUNTER — Ambulatory Visit (HOSPITAL_COMMUNITY): Payer: BC Managed Care – PPO | Admitting: Certified Registered"

## 2019-03-15 ENCOUNTER — Encounter (HOSPITAL_COMMUNITY)
Admission: RE | Disposition: A | Discharge status: Home / Self Care | Payer: Self-pay | Source: Home / Self Care | Attending: Surgery

## 2019-03-15 DIAGNOSIS — E559 Vitamin D deficiency, unspecified: Secondary | ICD-10-CM | POA: Diagnosis not present

## 2019-03-15 DIAGNOSIS — K621 Rectal polyp: Secondary | ICD-10-CM | POA: Diagnosis not present

## 2019-03-15 DIAGNOSIS — E1165 Type 2 diabetes mellitus with hyperglycemia: Secondary | ICD-10-CM | POA: Diagnosis not present

## 2019-03-15 DIAGNOSIS — K649 Unspecified hemorrhoids: Secondary | ICD-10-CM | POA: Diagnosis not present

## 2019-03-15 DIAGNOSIS — L739 Follicular disorder, unspecified: Secondary | ICD-10-CM | POA: Diagnosis not present

## 2019-03-15 DIAGNOSIS — Z6841 Body Mass Index (BMI) 40.0 and over, adult: Secondary | ICD-10-CM | POA: Diagnosis not present

## 2019-03-15 DIAGNOSIS — K644 Residual hemorrhoidal skin tags: Secondary | ICD-10-CM | POA: Diagnosis not present

## 2019-03-15 DIAGNOSIS — K648 Other hemorrhoids: Secondary | ICD-10-CM | POA: Diagnosis not present

## 2019-03-15 DIAGNOSIS — K645 Perianal venous thrombosis: Secondary | ICD-10-CM

## 2019-03-15 DIAGNOSIS — Z7984 Long term (current) use of oral hypoglycemic drugs: Secondary | ICD-10-CM | POA: Insufficient documentation

## 2019-03-15 DIAGNOSIS — L290 Pruritus ani: Secondary | ICD-10-CM

## 2019-03-15 DIAGNOSIS — E119 Type 2 diabetes mellitus without complications: Secondary | ICD-10-CM | POA: Insufficient documentation

## 2019-03-15 DIAGNOSIS — K604 Rectal fistula: Secondary | ICD-10-CM | POA: Diagnosis not present

## 2019-03-15 DIAGNOSIS — K642 Third degree hemorrhoids: Secondary | ICD-10-CM | POA: Insufficient documentation

## 2019-03-15 DIAGNOSIS — E785 Hyperlipidemia, unspecified: Secondary | ICD-10-CM | POA: Diagnosis not present

## 2019-03-15 DIAGNOSIS — E7800 Pure hypercholesterolemia, unspecified: Secondary | ICD-10-CM | POA: Diagnosis not present

## 2019-03-15 DIAGNOSIS — D128 Benign neoplasm of rectum: Secondary | ICD-10-CM | POA: Insufficient documentation

## 2019-03-15 DIAGNOSIS — E669 Obesity, unspecified: Secondary | ICD-10-CM | POA: Diagnosis not present

## 2019-03-15 DIAGNOSIS — Z79899 Other long term (current) drug therapy: Secondary | ICD-10-CM | POA: Diagnosis not present

## 2019-03-15 HISTORY — PX: RECTAL EXAM UNDER ANESTHESIA: SHX6399

## 2019-03-15 LAB — GLUCOSE, CAPILLARY
Glucose-Capillary: 154 mg/dL — ABNORMAL HIGH (ref 70–99)
Glucose-Capillary: 176 mg/dL — ABNORMAL HIGH (ref 70–99)

## 2019-03-15 SURGERY — EXAM UNDER ANESTHESIA, RECTUM
Anesthesia: General

## 2019-03-15 MED ORDER — EPHEDRINE SULFATE 50 MG/ML IJ SOLN
INTRAMUSCULAR | Status: DC | PRN
  Administered 2019-03-15: 7 mg via INTRAVENOUS

## 2019-03-15 MED ORDER — BUPIVACAINE LIPOSOME 1.3 % IJ SUSP
INTRAMUSCULAR | Status: DC | PRN
  Administered 2019-03-15: 20 mL

## 2019-03-15 MED ORDER — BUPIVACAINE-EPINEPHRINE 0.5% -1:200000 IJ SOLN
INTRAMUSCULAR | Status: DC | PRN
  Administered 2019-03-15: 20 mL

## 2019-03-15 MED ORDER — BUPIVACAINE-EPINEPHRINE 0.25% -1:200000 IJ SOLN
INTRAMUSCULAR | Status: AC
  Filled 2019-03-15: qty 1

## 2019-03-15 MED ORDER — GABAPENTIN 300 MG PO CAPS
300.0000 mg | ORAL_CAPSULE | ORAL | Status: AC
  Administered 2019-03-15: 300 mg via ORAL
  Filled 2019-03-15: qty 1

## 2019-03-15 MED ORDER — LIDOCAINE HCL (CARDIAC) PF 100 MG/5ML IV SOSY
PREFILLED_SYRINGE | INTRAVENOUS | Status: DC | PRN
  Administered 2019-03-15: 50 mg via INTRAVENOUS

## 2019-03-15 MED ORDER — METRONIDAZOLE IN NACL 5-0.79 MG/ML-% IV SOLN
500.0000 mg | INTRAVENOUS | Status: AC
  Administered 2019-03-15: 500 mg via INTRAVENOUS
  Filled 2019-03-15: qty 100

## 2019-03-15 MED ORDER — FENTANYL CITRATE (PF) 100 MCG/2ML IJ SOLN
INTRAMUSCULAR | Status: DC | PRN
  Administered 2019-03-15: 25 ug via INTRAVENOUS
  Administered 2019-03-15 (×2): 50 ug via INTRAVENOUS
  Administered 2019-03-15: 25 ug via INTRAVENOUS
  Administered 2019-03-15 (×2): 50 ug via INTRAVENOUS

## 2019-03-15 MED ORDER — PROPOFOL 10 MG/ML IV BOLUS
INTRAVENOUS | Status: DC | PRN
  Administered 2019-03-15: 210 mg via INTRAVENOUS

## 2019-03-15 MED ORDER — PHENYLEPHRINE 40 MCG/ML (10ML) SYRINGE FOR IV PUSH (FOR BLOOD PRESSURE SUPPORT)
PREFILLED_SYRINGE | INTRAVENOUS | Status: AC
  Filled 2019-03-15: qty 10

## 2019-03-15 MED ORDER — FENTANYL CITRATE (PF) 250 MCG/5ML IJ SOLN
INTRAMUSCULAR | Status: AC
  Filled 2019-03-15: qty 5

## 2019-03-15 MED ORDER — PROPOFOL 10 MG/ML IV BOLUS
INTRAVENOUS | Status: AC
  Filled 2019-03-15: qty 40

## 2019-03-15 MED ORDER — OXYCODONE HCL 5 MG PO TABS: 5.0000 mg | ORAL_TABLET | Freq: Once | ORAL | Status: DC | PRN

## 2019-03-15 MED ORDER — LIDOCAINE 2% (20 MG/ML) 5 ML SYRINGE
INTRAMUSCULAR | Status: AC
  Filled 2019-03-15: qty 5

## 2019-03-15 MED ORDER — OXYCODONE HCL 5 MG/5ML PO SOLN: 5.0000 mg | Freq: Once | ORAL | Status: DC | PRN

## 2019-03-15 MED ORDER — ONDANSETRON HCL 4 MG/2ML IJ SOLN: 4.0000 mg | Freq: Once | INTRAMUSCULAR | Status: DC | PRN

## 2019-03-15 MED ORDER — EPHEDRINE 5 MG/ML INJ
INTRAVENOUS | Status: AC
  Filled 2019-03-15: qty 10

## 2019-03-15 MED ORDER — ROCURONIUM BROMIDE 10 MG/ML (PF) SYRINGE
PREFILLED_SYRINGE | INTRAVENOUS | Status: AC
  Filled 2019-03-15: qty 10

## 2019-03-15 MED ORDER — ONDANSETRON HCL 4 MG/2ML IJ SOLN
INTRAMUSCULAR | Status: AC
  Filled 2019-03-15: qty 2

## 2019-03-15 MED ORDER — PHENYLEPHRINE HCL (PRESSORS) 10 MG/ML IV SOLN
INTRAVENOUS | Status: DC | PRN
  Administered 2019-03-15: 80 ug via INTRAVENOUS

## 2019-03-15 MED ORDER — CHLORHEXIDINE GLUCONATE CLOTH 2 % EX PADS: 6.0000 | MEDICATED_PAD | Freq: Once | CUTANEOUS | Status: DC

## 2019-03-15 MED ORDER — ARTIFICIAL TEARS OPHTHALMIC OINT
TOPICAL_OINTMENT | OPHTHALMIC | Status: AC
  Filled 2019-03-15: qty 3.5

## 2019-03-15 MED ORDER — MIDAZOLAM HCL 5 MG/5ML IJ SOLN
INTRAMUSCULAR | Status: DC | PRN
  Administered 2019-03-15: 2 mg via INTRAVENOUS

## 2019-03-15 MED ORDER — ONDANSETRON HCL 4 MG/2ML IJ SOLN
INTRAMUSCULAR | Status: DC | PRN
  Administered 2019-03-15: 4 mg via INTRAVENOUS

## 2019-03-15 MED ORDER — LACTATED RINGERS IV SOLN
INTRAVENOUS | Status: DC
  Administered 2019-03-15 (×2): via INTRAVENOUS

## 2019-03-15 MED ORDER — SUGAMMADEX SODIUM 500 MG/5ML IV SOLN
INTRAVENOUS | Status: DC | PRN
  Administered 2019-03-15: 300 mg via INTRAVENOUS

## 2019-03-15 MED ORDER — ROCURONIUM BROMIDE 100 MG/10ML IV SOLN
INTRAVENOUS | Status: DC | PRN
  Administered 2019-03-15: 10 mg via INTRAVENOUS
  Administered 2019-03-15: 20 mg via INTRAVENOUS
  Administered 2019-03-15: 60 mg via INTRAVENOUS

## 2019-03-15 MED ORDER — MIDAZOLAM HCL 2 MG/2ML IJ SOLN
INTRAMUSCULAR | Status: AC
  Filled 2019-03-15: qty 2

## 2019-03-15 MED ORDER — SODIUM CHLORIDE 0.9 % IV SOLN
2.0000 g | INTRAVENOUS | Status: AC
  Administered 2019-03-15: 2 g via INTRAVENOUS
  Filled 2019-03-15: qty 20

## 2019-03-15 MED ORDER — OXYCODONE HCL 5 MG PO TABS: 5.0000 mg | ORAL_TABLET | Freq: Four times a day (QID) | ORAL | 0 refills | Status: DC | PRN

## 2019-03-15 MED ORDER — ACETAMINOPHEN 500 MG PO TABS
1000.0000 mg | ORAL_TABLET | ORAL | Status: AC
  Administered 2019-03-15: 1000 mg via ORAL
  Filled 2019-03-15: qty 2

## 2019-03-15 MED ORDER — CELECOXIB 200 MG PO CAPS
200.0000 mg | ORAL_CAPSULE | ORAL | Status: AC
  Administered 2019-03-15: 06:00:00 200 mg via ORAL
  Filled 2019-03-15: qty 1

## 2019-03-15 MED ORDER — DIBUCAINE (PERIANAL) 1 % EX OINT
TOPICAL_OINTMENT | CUTANEOUS | Status: AC
  Filled 2019-03-15: qty 28

## 2019-03-15 MED ORDER — HYDROMORPHONE HCL 1 MG/ML IJ SOLN: 0.2500 mg | INTRAMUSCULAR | Status: DC | PRN

## 2019-03-15 MED ORDER — DEXAMETHASONE SODIUM PHOSPHATE 10 MG/ML IJ SOLN
INTRAMUSCULAR | Status: AC
  Filled 2019-03-15: qty 1

## 2019-03-15 SURGICAL SUPPLY — 34 items
BENZOIN TINCTURE PRP APPL 2/3 (GAUZE/BANDAGES/DRESSINGS) ×2 IMPLANT
BLADE SURG 15 STRL LF DISP TIS (BLADE) IMPLANT
BLADE SURG 15 STRL SS (BLADE)
BRIEF STRETCH FOR OB PAD LRG (UNDERPADS AND DIAPERS) ×2 IMPLANT
CONT SPEC 4OZ CLIKSEAL STRL BL (MISCELLANEOUS) ×2 IMPLANT
COVER SURGICAL LIGHT HANDLE (MISCELLANEOUS) ×2 IMPLANT
COVER WAND RF STERILE (DRAPES) IMPLANT
DECANTER SPIKE VIAL GLASS SM (MISCELLANEOUS) ×2 IMPLANT
DRAPE LAPAROTOMY T 102X78X121 (DRAPES) ×2 IMPLANT
DRSG PAD ABDOMINAL 8X10 ST (GAUZE/BANDAGES/DRESSINGS) IMPLANT
ELECT PENCIL ROCKER SW 15FT (MISCELLANEOUS) ×2 IMPLANT
ELECT REM PT RETURN 15FT ADLT (MISCELLANEOUS) ×2 IMPLANT
GAUZE 4X4 16PLY RFD (DISPOSABLE) ×2 IMPLANT
GAUZE SPONGE 4X4 12PLY STRL (GAUZE/BANDAGES/DRESSINGS) ×1 IMPLANT
GLOVE ECLIPSE 8.0 STRL XLNG CF (GLOVE) ×2 IMPLANT
GLOVE INDICATOR 8.0 STRL GRN (GLOVE) ×2 IMPLANT
GOWN STRL REUS W/TWL XL LVL3 (GOWN DISPOSABLE) ×4 IMPLANT
KIT BASIN OR (CUSTOM PROCEDURE TRAY) ×2 IMPLANT
KIT TURNOVER KIT A (KITS) ×1 IMPLANT
LOOP VESSEL MAXI BLUE (MISCELLANEOUS) IMPLANT
NEEDLE HYPO 22GX1.5 SAFETY (NEEDLE) ×2 IMPLANT
PACK BASIC VI WITH GOWN DISP (CUSTOM PROCEDURE TRAY) ×2 IMPLANT
SHEARS HARMONIC 9CM CVD (BLADE) IMPLANT
SURGILUBE 2OZ TUBE FLIPTOP (MISCELLANEOUS) ×3 IMPLANT
SUT CHROMIC 2 0 SH (SUTURE) ×2 IMPLANT
SUT CHROMIC 3 0 SH 27 (SUTURE) IMPLANT
SUT VIC AB 2-0 SH 27 (SUTURE)
SUT VIC AB 2-0 SH 27X BRD (SUTURE) IMPLANT
SUT VIC AB 2-0 UR6 27 (SUTURE) ×12 IMPLANT
SYR 20ML LL LF (SYRINGE) ×2 IMPLANT
SYR 3ML LL SCALE MARK (SYRINGE) IMPLANT
TOWEL OR 17X26 10 PK STRL BLUE (TOWEL DISPOSABLE) ×2 IMPLANT
TOWEL OR NON WOVEN STRL DISP B (DISPOSABLE) ×2 IMPLANT
YANKAUER SUCT BULB TIP 10FT TU (MISCELLANEOUS) ×2 IMPLANT

## 2019-03-15 NOTE — Anesthesia Procedure Notes (Signed)
Procedure Name: Intubation Date/Time: 03/15/2019 7:43 AM Performed by: Garrel Ridgel, CRNA Pre-anesthesia Checklist: Patient identified, Emergency Drugs available, Suction available, Patient being monitored and Timeout performed Patient Re-evaluated:Patient Re-evaluated prior to induction Oxygen Delivery Method: Circle system utilized Preoxygenation: Pre-oxygenation with 100% oxygen Induction Type: IV induction Ventilation: Mask ventilation without difficulty Laryngoscope Size: Mac and 4 Grade View: Grade II Tube type: Oral Tube size: 7.5 mm Number of attempts: 1 Airway Equipment and Method: Stylet and Oral airway Placement Confirmation: ETT inserted through vocal cords under direct vision,  positive ETCO2 and breath sounds checked- equal and bilateral Tube secured with: Tape Dental Injury: Teeth and Oropharynx as per pre-operative assessment

## 2019-03-15 NOTE — Transfer of Care (Signed)
Immediate Anesthesia Transfer of Care Note  Patient: Jason Waters  Procedure(s) Performed: ANORECTAL EXAM UNDER ANESTHESIA, REPAIR OF PERIRECTAL FISTULA, POSSIBLE HEMORROIDECTOMY (N/A )  Patient Location: PACU  Anesthesia Type:General  Level of Consciousness: awake, alert , oriented and patient cooperative  Airway & Oxygen Therapy: Patient Spontanous Breathing and Patient connected to face mask oxygen  Post-op Assessment: Report given to RN and Post -op Vital signs reviewed and stable  Post vital signs: Reviewed and stable  Last Vitals:  Vitals Value Taken Time  BP 127/81 03/15/19 0933  Temp    Pulse 84 03/15/19 0935  Resp 14 03/15/19 0935  SpO2 98 % 03/15/19 0935  Vitals shown include unvalidated device data.  Last Pain:  Vitals:   03/15/19 0542  TempSrc: Oral         Complications: No apparent anesthesia complications

## 2019-03-15 NOTE — Discharge Instructions (Signed)
ANORECTAL SURGERY:  POST OPERATIVE INSTRUCTIONS  ######################################################################  EAT Start with a pureed / full liquid diet After 24 hours, gradually transition to a high fiber diet.    CONTROL PAIN Control pain so you can tolerate bowel movements,  walk, sleep, tolerate sneezing/coughing, and go up/down stairs.   HAVE A BOWEL MOVEMENT DAILY Keep your bowels regular to avoid problems.   Taking a fiber supplement every day to keep bowels soft.   Try a laxative to override constipation. Use an antidairrheal to slow down diarrhea.   Call if not better after 2 tries  WALK Walk an hour a day.  Control your pain to do that.   CALL IF YOU HAVE PROBLEMS/CONCERNS Call if you are still struggling despite following these instructions. Call if you have concerns not answered by these instructions  ######################################################################    1. Take your usually prescribed home medications unless otherwise directed.  2. DIET: Follow a light bland diet & liquids the first 24 hours after arrival home, such as soup, liquids, starches, etc.  Be sure to drink plenty of fluids.  Quickly advance to a usual solid diet within a few days.  Avoid fast food or heavy meals as your are more likely to get nauseated or have irregular bowels.  A low-fat, high-fiber diet for the rest of your life is ideal.  3. PAIN CONTROL: a. Pain is best controlled by a usual combination of three different methods TOGETHER: i. Ice/Heat ii. Over the counter pain medication iii. Prescription pain medication b. Expect swelling and discomfort in the anus/rectal area.  Warm water baths (30-60 minutes up to 6 times a day, especially after bowel meovements) will help. Use ice for the first few days to help decrease swelling and bruising, then switch to heat such as warm towels, sitz baths, warm baths, etc to help relax tight/sore spots and speed recovery.   Some people prefer to use ice alone, heat alone, alternating between ice & heat.  Experiment to what works for you.   c. It is helpful to take an over-the-counter pain medication continuously for the first few weeks.  Choose one of the following that works best for you: i. Naproxen (Aleve, etc)  Two 235m tabs twice a day ii. Ibuprofen (Advil, etc) Three 2031mtabs four times a day (every meal & bedtime) iii. Acetaminophen (Tylenol, etc) 500-65083mour times a day (every meal & bedtime) d. A  prescription for pain medication (such as oxycodone, hydrocodone, etc) should be given to you upon discharge.  Take your pain medication as prescribed.  i. If you are having problems/concerns with the prescription medicine (does not control pain, nausea, vomiting, rash, itching, etc), please call us [phone] see if we need to switch you to a different pain medicine that will work better for you and/or control your side effect better. ii. If you need a refill on your pain medication, please contact your pharmacy.  They will contact our office to request authorization. Prescriptions will not be filled after 5 pm or on week-ends.  If can take up to 48 hours for it to be filled & ready so avoid waiting until you are down to thel ast pill. e. A topical cream (Dibucaine) or a prescription for a cream (such as diltiazem 2% gel) may be given to you.  Many people find relief with topical creams.  Some people find it burns too much.  Experiment.  If it helps, use it.  If it burns, don't using  it.  Use a Sitz Bath 4-8 times a day for relief   [employer] A sitz bath is a warm water bath taken in the sitting position that covers only the hips and buttocks. It may be used for either healing or hygiene purposes. Sitz baths are also used to relieve pain, itching, or muscle spasms. The water may contain medicine. Moist heat will help you heal and relax.  HOME CARE INSTRUCTIONS  Take 3 to 4 sitz baths a day. 1. Fill the  bathtub half full with warm water. 2. Sit in the water and open the drain a little. 3. Turn on the warm water to keep the tub half full. Keep the water running constantly. 4. Soak in the water for 15 to 20 minutes. 5. After the sitz bath, pat the affected area dry first.   4. KEEP YOUR BOWELS REGULAR a. The goal is one soft bowel movement a day b. Avoid getting constipated.  Between the surgery and the pain medications, it is common to experience some constipation.  Increasing fluid intake and taking a fiber supplement (such as Metamucil, Citrucel, FiberCon, MiraLax, etc) 2-3 times a day regularly will usually help prevent this problem from occurring.  A mild laxative (prune juice, Milk of Magnesia, MiraLax, etc) should be taken according to package directions if there are no bowel movements after 48 hours. c. Watch out for diarrhea.  If you have many loose bowel movements, simplify your diet to bland foods & liquids for a few days.  Stop any stool softeners and decrease your fiber supplement.  Switching to mild anti-diarrheal medications (Kayopectate, Pepto Bismol) can help.  Can try an imodium/loperamide dose.  If this worsens or does not improve, please call us.  5. Wound Care  a. Remove your bandages with your first bowel movement, usually the day after surgery.  You may have packing if you had an abscess.  Let any packing or gauze fall come out.   b. Wear an absorbent pad or soft cotton balls in your underwear as needed to catch any drainage and help keep the area  c. Keep the area clean and dry.  Bathe / shower every day.  Keep the area clean by showering / bathing over the incision / wound.   It is okay to soak an open wound to help wash it.  Consider using a squeeze bottle filled with warm water to gently wash the anal area.  Wet wipes or showers / gentle washing after bowel movements is often less traumatic than regular toilet paper. d. Dennis Bast will often notice bleeding with bowel movements.   This should slow down by the end of the first week of surgery.  Sitting on an ice pack can help. e. Expect some drainage.  This should slow down by the end of the first week of surgery, but you will have occasional bleeding or drainage up to a few months after surgery.  Wear an absorbent pad or soft cotton gauze in your underwear until the drainage stops.  6. ACTIVITIES as tolerated:   a. You may resume regular (light) daily activities beginning the next day--such as daily self-care, walking, climbing stairs--gradually increasing activities as tolerated.  If you can walk 30 minutes without difficulty, it is safe to try more intense activity such as jogging, treadmill, bicycling, low-impact aerobics, swimming, etc. b. Save the most intensive and strenuous activity for last such as sit-ups, heavy lifting, contact sports, etc  Refrain from any heavy lifting or straining  until you are off narcotics for pain control.   c. DO NOT PUSH THROUGH PAIN.  Let pain be your guide: If it hurts to do something, don't do it.  Pain is your body warning you to avoid that activity for another week until the pain goes down. d. You may drive when you are no longer taking prescription pain medication, you can comfortably sit for long periods of time, and you can safely maneuver your car and apply brakes. e. You may have sexual intercourse when it is comfortable.  7. FOLLOW UP in our office a. Please call [facility] at [phone] to set up an appointment to see your surgeon in the office for a follow-up appointment approximately 2-3 weeks after your surgery. b. Make sure that you call for this appointment the day you arrive home to ensure a convenient appointment time.  8. IF YOU HAVE DISABILITY OR FAMILY LEAVE FORMS, BRING THEM TO THE OFFICE FOR PROCESSING.  DO NOT GIVE THEM TO YOUR DOCTOR.        WHEN TO CALL US [phone]: 1. Poor pain control 2. Reactions / problems with new medications (rash/itching, nausea,  etc)  3. Fever over 101.5 F (38.5 C) 4. Inability to urinate 5. Nausea and/or vomiting 6. Worsening swelling or bruising 7. Continued bleeding from incision. 8. Increased pain, redness, or drainage from the incision  The clinic staff is available to answer your questions during regular business hours (8:30am-5pm).  Please don't hesitate to call and ask to speak to one of our nurses for clinical concerns.   A surgeon from [facility] is always on call at the hospitals   If you have a medical emergency, go to the nearest emergency room or call 911.    [facility] [address], [address], , [place]  [address] ? MAIN: [phone] ? TOLL FREE: [phone] ? FAX [phone] [email or web address]    HEMORRHOIDS   Hemorrhoidal piles are natural clusters of blood vessels that help the rectum and anal canal stretch to hold stool and allow bowel movements.  Most people will develop a flare of hemorrhoids in their lifetime.  When hemorrhoidals are irritated, they can swell, burn, itch, cause pain, and bleed.  Most flares will calm down gradually within a few weeks.  However, once hemorrhoids are created, they tend to flare more easily.  Fortunately, good habits and simple medical treatment usually control hemorrhoids well, and surgery is needed only in severe cases.  TREATMENT OF HEMORRHOID FLARE Warm soaks. 4-8 times a day This helps more than any topical medication.   1. A sitz bath is a warm water bath taken in the sitting position that covers only the hips and buttocks.Fill the bathtub half full with warm water. 2. Soak in the water for 15 to 30 minutes. 3. After the sitz bath, pat the affected area dry first.  Normalize your bowels.  Extremes of diarrhea or constipation will make hemorrhoids worse.  One soft bowel movement a day is the goal.   Wet wipes instead of toilet paper Pain control with a NSAID such as ibuprofen (Advil) or  naproxen (Aleve) or acetaminophen (Tylenol) around the clock.  Narcotics are constipating and should be minimized if possible Topical creams contain steroids (bydrocortisone) or local anesthetic (xylocaine) can help make pain and itching more tolerable.    TROUBLESHOOTING IRREGULAR BOWELS 1) Avoid extremes of bowel movements (no bad constipation/diarrhea) 2) Miralax 17gm in 8oz. water or juice every   day. May use twice a day.  3) Gas-x or Phazyme as needed for gas & bloating.  4) Soft & bland diet. No spicy, greasy, or fried foods.  5) Omeprazole over-the-counter as needed  6) May hold gluten/wheat products from diet to see if symptoms improve.  7)  May try probiotics (Align, Activa, etc) to help calm the bowels down 7) If symptoms become worse: Call back immediately.   Anal Pruritus Anal pruritus is an itchy feeling in the anus and on the skin around the anus. This is common and can be caused by many things. It often occurs when the area becomes moist. Moisture may be due to sweating or to a small amount of stool (feces) that is left on the area because of poor personal cleaning. Some other causes include:  Things that can irritate your skin, such as: ? Perfumed soaps and sprays. ? Colored or scented toilet paper. ? Excessive washing.  Certain foods, such as caffeine, beer, and spicy foods.  Diarrhea or loose stool.  Skin disorders (psoriasis, eczema, or seborrhea).  Hemorrhoids, fissures, infections, and other anal diseases.  Other medical conditions, such as diabetes, thyroid problems, STIs (sexually transmitted infections), or some cancers. In many cases, the cause is not known. The itching usually goes away with treatment and home care. Scratching can cause further skin damage and make the problem worse. Follow these instructions at home: Skin care      Practice good hygiene. ? Clean the anal area gently with wet toilet paper or a wet washcloth after every bowel movement  and at bedtime. ? Avoid using soaps on the anal area. ? Dry the area thoroughly. Pat the area dry with toilet paper or a towel.  Do not scrub the anal area with anything, including toilet paper.  Do not scratch the itchy area. Scratching causes more damage and makes the itching worse.  Take sitz baths as told by your health care provider. ? A sitz bath is a warm water bath that only comes up to your hips and covers your buttocks. A sitz bath may be done at home in a bathtub or with a portable sitz bath that fits over the toilet. ? Pat the area dry with a soft cloth after each bath.  Use creams or ointments as told by your health care provider. Zinc oxide ointment or a moisture barrier cream can be applied several times a day to protect and heal the skin.  Do not use anything that irritates the skin, such as bubble baths, scented toilet paper, or genital deodorants. General instructions  Pay attention to any changes in your symptoms.  Take or apply over-the-counter and prescription medicines only as told by your health care provider.  Avoid overusing medicines that help you have a bowel movement (laxatives). These can cause you to have loose stools.  Talk with your health care provider about whether you should increase the fiber in your diet. This can help keep your stool normal if you have frequent loose stools.  Limit or avoid foods that may cause your symptoms. These may include: ? Spicy foods, such as salsa, jalapeo peppers, and spicy seasonings. ? Caffeine or beer. ? Milk products. ? Chocolate, nuts, citrus fruits, or tomatoes.  Wear cotton underwear and loose clothing.  Keep all follow-up visits as told by your health care provider. This is important. Contact a health care provider if:  Your itching does not improve in several days.  Your itching gets worse.  You  have a fever.  You have redness, swelling, or pain in the anal area.  You have fluid, blood, or pus  coming from the anal area. Summary  Anal pruritus is an itchy feeling in the anus and the skin in the anal area. This can be caused by many things, such as things that irritate your skin and certain medical conditions.  Take or apply over-the-counter and prescription medicines only as told by your health care provider.  Practice good hygiene as told by your health care provider.  Talk with your health care provider about fiber supplements. These are helpful in keeping your stool normal if you have frequent loose stools.  Contact a health care provider if your symptoms get worse or if you develop new symptoms. This information is not intended to replace advice given to you by your health care provider. Make sure you discuss any questions you have with your health care provider. Document Released: 11/02/2010 Document Revised: 09/12/2017 Document Reviewed: 09/12/2017 Elsevier Patient Education  2020 [employer].

## 2019-03-15 NOTE — H&P (Signed)
Jason Waters Documented: 01/30/2019 10:18 AM   Patient Care Team: [name], MD as PCP - General (Family Medicine) ` ` Patient sent for surgical consultation at the request of Jason Waters  Chief Complaint: Recurrent anal abscesses. Possible fistula ` ` The patient is a pleasant obese male who is struggled with boils around his anus for the past decade. A painful lump. Usually they will pop on its own or he pops it. Dries up. However he feels like it keeps coming back. Has not a point warts happening about every 2 months. Last episode was rather painful. Size primary care physician. Placed on Bactrim. Sent to our office. By the time he saw [place] he was starting to drain. He completed his Bactrim antibiotics. Because of the history of recurrent abscesses, fistula suspected. Referred for evaluation.  Patient moves his bowels about twice a day. He does not smoke. He has borderline diabetes currently on metformin only. He can walk 30 minutes without difficulty. No cardiac or pulmonary issues. He does have a few lumps on his forearms and but told there lipomas. No skin infections or other infections. No MRSA. He thinks his mother was diagnosed with some type of colitis at some point but does not recall being diagnosed with ulcerative colitis. No personal nor family history of GI/colon cancer, inflammatory bowel disease, irritable bowel syndrome, allergy such as Celiac Sprue, dietary/dairy problems, colitis, ulcers nor gastritis. No recent sick contacts/gastroenteritis. No travel outside the country. No changes in diet. No dysphagia to solids or liquids. No significant heartburn or reflux. No hematochezia, hematemesis, coffee ground emesis. No evidence of prior gastric/peptic ulceration.  (Review of systems as stated in this history (HPI) or in the review of systems. Otherwise all other 12 point ROS are negative) ` ` `   Past Surgical History  Jason Waters, CMA; 01/30/2019 10:18 AM) Oral Surgery Spinal Surgery - Lower Back  Diagnostic Studies History Jason Waters, CMA; 01/30/2019 10:18 AM) Colonoscopy never  Allergies ([name], CMA; 01/30/2019 10:19 AM) No Known Allergies [01/30/2019]: No Known Drug Allergies [07/16/2015]: Allergies Reconciled  Medication History ([name], CMA; 01/30/2019 10:20 AM) metFORMIN HCl ER (500MG  Tablet ER 24HR, Oral) Active. Vitamin D (Ergocalciferol) (1.25 MG(50000 UT) Capsule, Oral) Active. Simvastatin (20MG  Tablet, Oral) Active. Ondansetron HCl (8MG  Tablet, Oral) Active. metFORMIN HCl (500MG  Tablet, Oral) Active. Medications Reconciled  Other Problems Jason Waters, CMA; 01/30/2019 10:18 AM) Diabetes Mellitus Hypercholesterolemia     Review of Systems ([name] CMA; 01/30/2019 10:18 AM) General Not Present- Appetite Loss, Chills, Fatigue, Fever, Night Sweats, Weight Gain and Weight Loss. Skin Not Present- Change in Wart/Mole, Dryness, Hives, Jaundice, New Lesions, Non-Healing Wounds, Rash and Ulcer. HEENT Not Present- Earache, Hearing Loss, Hoarseness, Nose Bleed, Oral Ulcers, Ringing in the Ears, Seasonal Allergies, Sinus Pain, Sore Throat, Visual Disturbances, Wears glasses/contact lenses and Yellow Eyes. Respiratory Not Present- Bloody sputum, Chronic Cough, Difficulty Breathing, Snoring and Wheezing. Cardiovascular Not Present- Chest Pain, Difficulty Breathing Lying Down, Leg Cramps, Palpitations, Rapid Heart Rate, Shortness of Breath and Swelling of Extremities. Gastrointestinal Present- Rectal Pain. Not Present- Abdominal Pain, Bloating, Bloody Stool, Change in Bowel Habits, Chronic diarrhea, Constipation, Difficulty Swallowing, Excessive gas, Gets full quickly at meals, Hemorrhoids, Indigestion, Nausea and Vomiting. Male Genitourinary Not Present- Blood in Urine, Change in Urinary Stream, Frequency, Impotence, Nocturia, Painful Urination, Urgency  and Urine Leakage. Musculoskeletal Not Present- Back Pain, Joint Pain, Joint Stiffness, Muscle Pain, Muscle Weakness and Swelling of Extremities. Neurological Present- Numbness. Not Present- Decreased Memory,  Fainting, Headaches, Seizures, Tingling, Tremor, Trouble walking and Weakness. Psychiatric Not Present- Anxiety, Bipolar, Change in Sleep Pattern, Depression, Fearful and Frequent crying. Endocrine Not Present- Cold Intolerance, Excessive Hunger, Hair Changes, Heat Intolerance, Hot flashes and New Diabetes. Hematology Not Present- Blood Thinners, Easy Bruising, Excessive bleeding, Gland problems, HIV and Persistent Infections.  Vitals ([name] CMA; 01/30/2019 10:21 AM) 01/30/2019 10:20 AM Weight: 302.25 lb Height: 70in Body Surface Area: 2.49 m Body Mass Index: 43.37 kg/m  Temp.: 96.83F(Temporal)  Pulse: 97 (Regular)  BP: 132/78 (Sitting, Left Arm, Standard)   BP (!) 159/79   Pulse 74   Temp 97.9 F (36.6 C) (Oral)   Resp 18   SpO2 96%       Physical Exam [name] MD; 01/30/2019 10:46 AM)  General Mental Status-Alert. General Appearance-Not in acute distress, Not Sickly. Orientation-Oriented X3. Hydration-Well hydrated. Voice-Normal.  Integumentary Global Assessment Upon inspection and palpation of skin surfaces of the - Axillae: non-tender, no inflammation or ulceration, no drainage. and Distribution of scalp and body hair is normal. General Characteristics Temperature - normal warmth is noted.  Head and Neck Head-normocephalic, atraumatic with no lesions or palpable masses. Face Global Assessment - atraumatic, no absence of expression. Neck Global Assessment - no abnormal movements, no bruit auscultated on the right, no bruit auscultated on the left, no decreased range of motion, non-tender. Trachea-midline. Thyroid Gland Characteristics - non-tender.  Eye Eyeball - Left-Extraocular movements intact, No  Nystagmus - Left. Eyeball - Right-Extraocular movements intact, No Nystagmus - Right. Cornea - Left-No Hazy - Left. Cornea - Right-No Hazy - Right. Sclera/Conjunctiva - Left-No scleral icterus, No Discharge - Left. Sclera/Conjunctiva - Right-No scleral icterus, No Discharge - Right. Pupil - Left-Direct reaction to light normal. Pupil - Right-Direct reaction to light normal.  ENMT Ears Pinna - Left - no drainage observed, no generalized tenderness observed. Pinna - Right - no drainage observed, no generalized tenderness observed. Nose and Sinuses External Inspection of the Nose - no destructive lesion observed. Inspection of the nares - Left - quiet respiration. Inspection of the nares - Right - quiet respiration. Mouth and Throat Lips - Upper Lip - no fissures observed, no pallor noted. Lower Lip - no fissures observed, no pallor noted. Nasopharynx - no discharge present. Oral Cavity/Oropharynx - Tongue - no dryness observed. Oral Mucosa - no cyanosis observed. Hypopharynx - no evidence of airway distress observed.  Chest and Lung Exam Inspection Movements - Normal and Symmetrical. Accessory muscles - No use of accessory muscles in breathing. Palpation Palpation of the chest reveals - Non-tender. Auscultation Breath sounds - Normal and Clear.  Cardiovascular Auscultation Rhythm - Regular. Murmurs & Other Heart Sounds - Auscultation of the heart reveals - No Murmurs and No Systolic Clicks.  Abdomen Inspection Inspection of the abdomen reveals - No Visible peristalsis and No Abnormal pulsations. Umbilicus - No Bleeding, No Urine drainage. Palpation/Percussion Palpation and Percussion of the abdomen reveal - Soft, Non Tender, No Rebound tenderness, No Rigidity (guarding) and No Cutaneous hyperesthesia. Note: Abdomen obese but soft. Mild supraumbilical diastases recti. Nontender. Not distended. No umbilical or incisional hernias. No guarding.  Male  Genitourinary Sexual Maturity Tanner 5 - Adult hair pattern and Adult penile size and shape. Note: No inguinal hernias. Normal external genitalia. Epididymi, testes, and spermatic cords normal without any masses.  Rectal Note: Perianal skin clear. Subtle right posterior perianal punctate scar. However no cord. No drainage. about 2.5 cm from anal verge but no active sinus. No drainage with  rectal wall pressure.  Anterior midline perianal region is thickened and inflamed with a 3 mm punctate opening within the centimeter the anal verge. This is the most sensitive tender spot for him. Suspicious of anterior midline fistula.  No anal fissure. No external hemorrhoids. No prolapsing internal hemorrhoids. No active abscess. No pilonidal disease. Normal sphincter tone. Tolerates digital exam. Soft internal hemorrhoids without enlargement. No prolapse. No bleeding. Held off on anoscopy given his discomfort.  Peripheral Vascular Upper Extremity Inspection - Left - No Cyanotic nailbeds - Left, Not Ischemic. Inspection - Right - No Cyanotic nailbeds - Right, Not Ischemic.  Neurologic Neurologic evaluation reveals -normal attention span and ability to concentrate, able to name objects and repeat phrases. Appropriate fund of knowledge , normal sensation and normal coordination. Mental Status Affect - not angry, not paranoid. Cranial Nerves-Normal Bilaterally. Gait-Normal.  Neuropsychiatric Mental status exam performed with findings of-able to articulate well with normal speech/language, rate, volume and coherence, thought content normal with ability to perform basic computations and apply abstract reasoning and no evidence of hallucinations, delusions, obsessions or homicidal/suicidal ideation.  Musculoskeletal Global Assessment Spine, Ribs and Pelvis - no instability, subluxation or laxity. Right Upper Extremity - no instability, subluxation or laxity.  Lymphatic Head &  Neck  General Head & Neck Lymphatics: Bilateral - Description - No Localized lymphadenopathy. Axillary  General Axillary Region: Bilateral - Description - No Localized lymphadenopathy. Femoral & Inguinal  Generalized Femoral & Inguinal Lymphatics: Left - Description - No Localized lymphadenopathy. Right - Description - No Localized lymphadenopathy.    Assessment & Plan  ANAL FISTULA (K60.3) Impression: Recurrent episodes of anterior anal pain and abscesses. probable of the fistula. Hopefully this is just superficial. However I don't think this will go away without surgery. I recommended an outpatient examination under anesthesia with possible fistulotomy/fistula excision. Possible LIFT repair if intersphincteric. Postoperative pain and goals of recovery and following wound were discussed. He is motivated to proceed with surgery as soon as possible.  Current Plans Pt Education - CCS Abscess/Fistula (AT): discussed with patient and provided information. The anatomy & physiology of the anorectal region was discussed. We discussed the pathophysiology of anorectal abscess and fistula. Differential diagnosis was discussed. Natural history progression was discussed. I stressed the importance of a bowel regimen to have daily soft bowel movements to minimize progression of disease.  The patient's condition is not adequately controlled. Non-operative treatment has not healed the fistula. Therefore, I recommended examination under anaesthesia to confirm the diagnosis and treat the fistula. I discussed techniques that may be required such as fistulotomy, ligation by LIFT technique, and/or seton placement. Benefits & alternatives discussed. I noted a good likelihood this will help address the problem, but sometimes repeat operations and prolonged healing times may occur. Risks such as bleeding, pain, recurrence, reoperation, incontinence, heart attack, death, and other risks were  discussed.  Educational handouts further explaining the pathology, treatment options, and bowel regimen were given. The patient expressed understanding & wishes to proceed. We will work to coordinate surgery for a mutually convenient time.   [name], MD, FACS, MASCRS Gastrointestinal and Minimally Invasive Surgery    [address] [place], [place] [address] [phone] Main / Paging [phone] Fax

## 2019-03-15 NOTE — Interval H&P Note (Signed)
History and Physical Interval Note:  03/15/2019 7:41 AM  Jason Waters  has presented today for surgery, with the diagnosis of PERIRECTAL FISTULA.  The various methods of treatment have been discussed with the patient and family. After consideration of risks, benefits and other options for treatment, the patient has consented to  Procedure(s): ANORECTAL EXAM UNDER ANESTHESIA, REPAIR OF PERIRECTAL FISTULA, POSSIBLE HEMORROIDECTOMY (N/A) as a surgical intervention.  The patient's history has been reviewed, patient examined, no change in status, stable for surgery.  I have reviewed the patient's chart and labs.  Questions were answered to the patient's satisfaction.    I have re-reviewed the the patient's records, history, medications, and allergies.  I have re-examined the patient.  I again discussed intraoperative plans and goals of post-operative recovery.  The patient agrees to proceed.  Jason Waters  05/17/1971 [id number]  Patient Care Team: [name], MD as PCP - General (Family Medicine)  Patient Active Problem List   Diagnosis Date Noted  . Perianal abscess 01/23/2019  . Lipoma of upper extremity 06/22/2018  . Brachial plexus neuropathy 06/22/2018  . Hematospermia 06/22/2018  . Controlled type 2 diabetes mellitus without complication, without long-term current use of insulin ([place]) 03/23/2018  . Vitamin D deficiency 03/23/2018  . Elevated LDL cholesterol level 03/21/2018  . Pre-diabetes 03/21/2018  . Encounter for health maintenance examination with abnormal findings 03/21/2018  . Effusion of acromioclavicular joint, left 04/11/2017  . Closed fracture of distal clavicle 04/11/2017  . Calcific bursitis of shoulder 04/11/2017  . Skin lesion 02/23/2017  . Chronic left shoulder pain 02/23/2017  . Morbid obesity ([place]) 02/23/2017    Past Medical History:  Diagnosis Date  . Diabetes mellitus without complication ([place])   . Medical history non-contributory      Past Surgical History:  Procedure Laterality Date  . HAND SURGERY Left   . LUMBAR LAMINECTOMY/DECOMPRESSION MICRODISCECTOMY Right 07/09/2015   Procedure: RIGHT SIDED LUMBAR 5-SACRUM 1 MICRODISECTOMY;  Surgeon: Phylliss Bob, MD;  Location: [facility];  Service: Orthopedics;  Laterality: Right;  Right sided lumbar 5-sacrum 1 microdisectomy  . WISDOM TOOTH EXTRACTION      Social History   Socioeconomic History  . Marital status: Married    Spouse name: Not on file  . Number of children: Not on file  . Years of education: Not on file  . Highest education level: Not on file  Occupational History  . Not on file  Social Needs  . Financial resource strain: Not on file  . Food insecurity    Worry: Not on file    Inability: Not on file  . Transportation needs    Medical: Not on file    Non-medical: Not on file  Tobacco Use  . Smoking status: Never Smoker  . Smokeless tobacco: Never Used  Substance and Sexual Activity  . Alcohol use: Yes    Comment: rarely  . Drug use: No  . Sexual activity: Yes    Birth control/protection: None  Lifestyle  . Physical activity    Days per week: Not on file    Minutes per session: Not on file  . Stress: Not on file  Relationships  . Social Herbalist on phone: Not on file    Gets together: Not on file    Attends religious service: Not on file    Active member of club or organization: Not on file    Attends meetings of clubs or organizations: Not on file  Relationship status: Not on file  . Intimate partner violence    Fear of current or ex partner: Not on file    Emotionally abused: Not on file    Physically abused: Not on file    Forced sexual activity: Not on file  Other Topics Concern  . Not on file  Social History Narrative  . Not on file    Family History  Problem Relation Age of Onset  . Diabetes Father   . Heart disease Father     Medications Prior to Admission  Medication Sig Dispense Refill Last Dose  .  ergocalciferol (VITAMIN D2) 1.25 MG (50000 UT) capsule Take 50,000 Units by mouth once a week.   03/14/2019 at Unknown time  . metFORMIN (GLUCOPHAGE-XR) 500 MG 24 hr tablet Take 1 tablet (500 mg total) by mouth at bedtime. 90 tablet 1 03/14/2019 at Unknown time  . simvastatin (ZOCOR) 20 MG tablet Take 1 tablet (20 mg total) by mouth at bedtime. 90 tablet 3 03/14/2019 at Unknown time    Current Facility-Administered Medications  Medication Dose Route Frequency Provider Last Rate Last Dose  . bupivacaine liposome (EXPAREL) 1.3 % injection 266 mg  20 mL Infiltration Once Michael Boston, MD      . cefTRIAXone (ROCEPHIN) 2 g in sodium chloride 0.9 % 100 mL IVPB  2 g Intravenous On Call to OR Michael Boston, MD       And  . metroNIDAZOLE (FLAGYL) IVPB 500 mg  500 mg Intravenous On Call to OR Michael Boston, MD      . Chlorhexidine Gluconate Cloth 2 % PADS 6 each  6 each Topical Once Michael Boston, MD       And  . Chlorhexidine Gluconate Cloth 2 % PADS 6 each  6 each Topical Once Michael Boston, MD      . lactated ringers infusion   Intravenous Continuous Josephine Igo, MD 50 mL/hr at 03/15/19 0550       Allergies  Allergen Reactions  . Bee Venom Swelling    BP (!) 159/79   Pulse 74   Temp 97.9 F (36.6 C) (Oral)   Resp 18   SpO2 96%   Labs: Results for orders placed or performed during the hospital encounter of 03/15/19 (from the past 48 hour(s))  Glucose, capillary     Status: Abnormal   Collection Time: 03/15/19  5:37 AM  Result Value Ref Range   Glucose-Capillary 154 (H) 70 - 99 mg/dL   Comment 1 Notify RN    Comment 2 Document in Chart     Imaging / Studies: No results found.   Jason Waters, M.D., F.A.C.S. Gastrointestinal and Minimally Invasive Surgery [facility] [address] [place], [place] [address] [phone] Main / Paging  03/15/2019 7:41 AM    Jason Waters

## 2019-03-15 NOTE — Op Note (Signed)
03/15/2019  9:12 AM  PATIENT:  Jason Waters  48 y.o. male  Patient Care Team: [name], MD as PCP - General (Family Medicine) Michael Boston, MD as Consulting Physician (General Surgery)  PRE-OPERATIVE DIAGNOSIS:  PERIRECTAL FISTULA  POST-OPERATIVE DIAGNOSIS:  Grade 3 prolapsing internal hemorrhoids  Perianal pruritus/folliculitis as a result  Left lateral thrombosed hemorrhoid  Rectal polyp   PROCEDURE:    Internal and external hemorrhoidectomy  x2  Excision of chronic perianal folliculitis Internal hemorrhoidal ligation and pexy Unroofing of thrombosed hemorrhoid Rectal polypectomy Anorectal examination under anesthesia  SURGEON:  [name], MD  ANESTHESIA:   General Anorectal & Local field block (0.25% bupivacaine with epinephrine mixed with Liposomal bupivacaine (Experel)   EBL:  Total I/O In: 1000 [I.V.:1000] Out: 20 [Blood:20].  See operative record  Delay start of Pharmacological VTE agent (>24hrs) due to surgical blood loss or risk of bleeding:  NO  DRAINS: NONE  SPECIMEN:   Internal & external hemorrhoid x2  DISPOSITION OF SPECIMEN:  PATHOLOGY  COUNTS:  YES  PLAN OF CARE: Discharge home after PACU  PATIENT DISPOSITION:  PACU - hemodynamically stable.  INDICATION: Pleasant patient with struggles with intermittent perianal abscesses and irritation/folliculitis.  Suspicious for possible chronic fistula.     The anatomy & physiology of the anorectal region was discussed.  We discussed the pathophysiology of anorectal abscess and fistula.  Differential diagnosis was discussed.  Natural history progression was discussed.   I stressed the importance of a bowel regimen to have daily soft bowel movements to minimize progression of disease.     The patient's condition is not adequately controlled.  Non-operative treatment has not healed the fistula.  Therefore, I recommended examination under anaesthesia to confirm the diagnosis and  treat the fistula.  I discussed techniques that may be required such as fistulotomy, ligation by LIFT technique, and/or seton placement.  Benefits & alternatives discussed.  I noted a good likelihood this will help address the problem, but sometimes repeat operations and prolonged healing times may occur.  Risks such as bleeding, pain, recurrence, reoperation, injury to other organs, need for repair of tissues / organs reoperation, incontinence, heart attack, death, and other risks were discussed.      Educational handouts further explaining the pathology, treatment options, and bowel regimen were given.  The patient expressed understanding & wishes to proceed.  We will work to coordinate surgery for a mutually convenient time.   OR FINDINGS: Right anterior chronic areas of folliculitis but no true fistula.  Posterior midline perianal scarring mild concerning for prior area of folliculitis but no active fistula or abscess.  Right anterior greater > right posterior > left lateral grade 3 prolapsing hemorrhoids.  Left lateral pile with thrombosed hemorrhoids.  Most likely causing chronic pruritus and therefore the perianal pruritus/folliculitis  5 mm round elevated smooth polyp anterior rectum about 7 cm from the anal verge.  Excised with polypectomy.  Probably hyperplastic/chronic prolapse  No fissure.  No abscess.  No fistula.  No pilonidal disease.  DESCRIPTION:   Informed consent was confirmed. Patient underwent general anesthesia without difficulty. Patient was placed into prone positioning.  The perianal region was prepped and draped in sterile fashion. Surgical time-out confirmed our plan.  I did digital rectal examination and then transitioned over to anoscopy to get a sense of the anatomy.  Findings noted above.  There is no evidence of any superficial nor deep fistula.  Scarring posterior midline but no active inflammation.  Right  anterior with some folliculitis/pruritus near the largest  prolapsing hemorrhoid.  Because I suspected he had chronic pruritus/folliculitis causing his abscesses due to prolapsing hemorrhoids, I proceeded to do hemorrhoidal ligation and pexy.  I used a 2-0 Vicryl suture on a UR-6 needle in a figure-of-eight fashion 6 cm proximal to the anal verge.  I started at the largest hemorrhoid pile.  Because of redundant hemorrhoidal tissue too bulky to merely ligate or pexy, I excised the excess internal hemorrhoid piles longitudinally in a fusiform biconcave fashion, at the  right anterior and right posterior location, sparing the anal canal to avoid narrowing.  I excised in the right anterior perianal region 4 x 2 cm to get rid of area of chronic irritation/folliculitis involving the external hemorrhoid pile as well.  At the hemorrhoidectomy locations, I would run a 2-0 stitch longitudinally more distally to close the hemorrhoidectomy wound to the anal verge over a Parks self retaining retractor & occasionally a large Hill-Furgeson retractor to avoid narrowing of the anal canal.  I then tied that stitch down to cause a hemorrhoidopexy.   Patient also had a left lateral hemorrhoid pile with some thrombosed hemorrhoids within it.  I opened up and removed and excised them and did hemorrhoidal ligation and pexy on the left lateral pile.  I did not feel like I need to excise any excess tissue.  There was a elevated spherical mass 5 mm anterior rectal wall about 7 cm from the anal verge.  Concerning for a polyp.  Went ahead and excised that with cautery and sent separately.  Base of that wound was closed with one of the ligation/pexy stitches  I then did hemorrhoidal ligation and pexy at the other 4 columns.  At the completion of this, all 6 anorectal columns were ligated and pexied in the classic hexagonal fashion (right anterior/lateral/posterior, left anterior/lateral/posterior).   I closed the external part of the right posterior hemorrhoidectomy wound with interrupted  horizontal mattress 2-0 chromic suture, leaving the last 5 mm open to allow natural drainage.  I left the right anterior external wound open to let it gradually granulate in since that was the area of chronic pruritus/folliculitis  I redid anoscopy & examination.  At completion of this, all hemorrhoids had been removed or reduced into the rectum.  There is no more prolapse.  Internal & external anatomy was more more normal.  Hemostasis was good.  Fluffed gauze was on-laid over the perianal region.  No packing done.  Patient is being extubated go to go to the recovery room.  I had discussed postop care in detail with the patient in the preop holding area.  Instructions for post-operative recovery and prescriptions are written.  I made an attempt to call his wife but only got her voicemail and her box was full.  We will try and follow-up with her later.  [name], M.D., F.A.C.S. Gastrointestinal and Minimally Invasive Surgery [facility] [address], [place], [place] [address] [phone] Main / Paging

## 2019-03-15 NOTE — Anesthesia Postprocedure Evaluation (Signed)
Anesthesia Post Note  Patient: Jason Waters  Procedure(s) Performed: ANORECTAL EXAM UNDER ANESTHESIA, REPAIR OF PERIRECTAL FISTULA, POSSIBLE HEMORROIDECTOMY (N/A )     Patient location during evaluation: PACU Anesthesia Type: General Level of consciousness: awake and alert Pain management: pain level controlled Vital Signs Assessment: post-procedure vital signs reviewed and stable Respiratory status: spontaneous breathing, nonlabored ventilation and respiratory function stable Cardiovascular status: blood pressure returned to baseline and stable Postop Assessment: no apparent nausea or vomiting Anesthetic complications: no    Last Vitals:  Vitals:   03/15/19 0945 03/15/19 1000  BP: 134/76   Pulse: 81 79  Resp: 11 15  Temp:    SpO2: 97% 93%    Last Pain:  Vitals:   03/15/19 1000  TempSrc:   PainSc: 0-No pain                 Chenay Nesmith A.

## 2019-03-16 ENCOUNTER — Encounter (HOSPITAL_COMMUNITY): Payer: Self-pay | Admitting: Surgery

## 2019-03-16 LAB — SURGICAL PATHOLOGY

## 2019-07-02 ENCOUNTER — Ambulatory Visit (INDEPENDENT_AMBULATORY_CARE_PROVIDER_SITE_OTHER): Payer: BC Managed Care – PPO | Admitting: Family Medicine

## 2019-07-02 ENCOUNTER — Other Ambulatory Visit: Payer: Self-pay

## 2019-07-02 ENCOUNTER — Encounter: Payer: Self-pay | Admitting: Family Medicine

## 2019-07-02 VITALS — BP 138/82 | HR 85 | Temp 98.1°F | Ht 70.0 in | Wt 307.0 lb

## 2019-07-02 DIAGNOSIS — E7800 Pure hypercholesterolemia, unspecified: Secondary | ICD-10-CM

## 2019-07-02 DIAGNOSIS — K610 Anal abscess: Secondary | ICD-10-CM | POA: Diagnosis not present

## 2019-07-02 DIAGNOSIS — E119 Type 2 diabetes mellitus without complications: Secondary | ICD-10-CM

## 2019-07-02 DIAGNOSIS — K621 Rectal polyp: Secondary | ICD-10-CM

## 2019-07-02 DIAGNOSIS — E559 Vitamin D deficiency, unspecified: Secondary | ICD-10-CM

## 2019-07-02 NOTE — Progress Notes (Addendum)
Established Patient Office Visit  Subjective:  Patient ID: Jason Waters, male    DOB: 17-Nov-1970  Age: 49 y.o. MRN: [id number]  CC:  Chief Complaint  Patient presents with  . Follow-up    Routine check up on medications.     HPI [name] presents for follow-up of his diabetes, elevated cholesterol and vitamin D deficiency.  Continues to take all medicines as directed.  Diabetes has historically been under good control.  He was hospitalized for surgical correction of a perirectal abscess back at the end of October.  Hemoglobin A1c checked at that time was 6.9.  Last LDL cholesterol was 89.  Hyperplastic polyp was removed during surgery mentioned above.  He continues to have blood with wiping.  Last encounter with the surgeon was in December.  No recent diabetic eye check.  Past Medical History:  Diagnosis Date  . Diabetes mellitus without complication ([place])   . Medical history non-contributory     Past Surgical History:  Procedure Laterality Date  . HAND SURGERY Left   . LUMBAR LAMINECTOMY/DECOMPRESSION MICRODISCECTOMY Right 07/09/2015   Procedure: RIGHT SIDED LUMBAR 5-SACRUM 1 MICRODISECTOMY;  Surgeon: Phylliss Bob, MD;  Location: [facility];  Service: Orthopedics;  Laterality: Right;  Right sided lumbar 5-sacrum 1 microdisectomy  . RECTAL EXAM UNDER ANESTHESIA N/A 03/15/2019   Procedure: ANORECTAL EXAM UNDER ANESTHESIA, REPAIR OF PERIRECTAL FISTULA, POSSIBLE HEMORROIDECTOMY;  Surgeon: Michael Boston, MD;  Location: [facility];  Service: General;  Laterality: N/A;  . WISDOM TOOTH EXTRACTION      Family History  Problem Relation Age of Onset  . Diabetes Father   . Heart disease Father     Social History   Socioeconomic History  . Marital status: Married    Spouse name: Not on file  . Number of children: Not on file  . Years of education: Not on file  . Highest education level: Not on file  Occupational History  . Not on file  Tobacco Use  . Smoking status: Never  Smoker  . Smokeless tobacco: Never Used  Substance and Sexual Activity  . Alcohol use: Yes    Comment: rarely  . Drug use: No  . Sexual activity: Yes    Birth control/protection: None  Other Topics Concern  . Not on file  Social History Narrative  . Not on file   Social Determinants of Health   Financial Resource Strain:   . Difficulty of Paying Living Expenses: Not on file  Food Insecurity:   . Worried About Charity fundraiser in the Last Year: Not on file  . Ran Out of Food in the Last Year: Not on file  Transportation Needs:   . Lack of Transportation (Medical): Not on file  . Lack of Transportation (Non-Medical): Not on file  Physical Activity:   . Days of Exercise per Week: Not on file  . Minutes of Exercise per Session: Not on file  Stress:   . Feeling of Stress : Not on file  Social Connections:   . Frequency of Communication with Friends and Family: Not on file  . Frequency of Social Gatherings with Friends and Family: Not on file  . Attends Religious Services: Not on file  . Active Member of Clubs or Organizations: Not on file  . Attends Archivist Meetings: Not on file  . Marital Status: Not on file  Intimate Partner Violence:   . Fear of Current or Ex-Partner: Not on file  . Emotionally Abused:  Not on file  . Physically Abused: Not on file  . Sexually Abused: Not on file    Outpatient Medications Prior to Visit  Medication Sig Dispense Refill  . ergocalciferol (VITAMIN D2) 1.25 MG (50000 UT) capsule Take 50,000 Units by mouth once a week.    . metFORMIN (GLUCOPHAGE-XR) 500 MG 24 hr tablet Take 1 tablet (500 mg total) by mouth at bedtime. 90 tablet 1  . simvastatin (ZOCOR) 20 MG tablet Take 1 tablet (20 mg total) by mouth at bedtime. 90 tablet 3  . oxyCODONE (OXY IR/ROXICODONE) 5 MG immediate release tablet Take 1-2 tablets (5-10 mg total) by mouth every 6 (six) hours as needed for moderate pain, severe pain or breakthrough pain. (Patient not  taking: Reported on 07/02/2019) 30 tablet 0   No facility-administered medications prior to visit.    Allergies  Allergen Reactions  . Bee Venom Swelling    ROS Review of Systems  Constitutional: Negative.   HENT: Negative.   Eyes: Negative for photophobia and visual disturbance.  Respiratory: Negative.   Cardiovascular: Negative.   Gastrointestinal: Positive for anal bleeding. Negative for blood in stool.  Endocrine: Negative for polyphagia and polyuria.  Genitourinary: Negative.   Musculoskeletal: Negative for gait problem and joint swelling.  Allergic/Immunologic: Negative for immunocompromised state.  Neurological: Negative.   Hematological: Negative.   Psychiatric/Behavioral: Negative.    Depression screen PHQ 2/9 02/22/2017  Decreased Interest 0  Down, Depressed, Hopeless 0  PHQ - 2 Score 0      Objective:    Physical Exam  Constitutional: He is oriented to person, place, and time. He appears well-developed and well-nourished. No distress.  HENT:  Head: Normocephalic and atraumatic.  Right Ear: External ear normal.  Left Ear: External ear normal.  Eyes: Pupils are equal, round, and reactive to light. Conjunctivae are normal. Right eye exhibits no discharge. Left eye exhibits no discharge. No scleral icterus.  Neck: No JVD present. No tracheal deviation present. No thyromegaly present.  Cardiovascular: Normal rate, regular rhythm and normal heart sounds.  Pulmonary/Chest: Effort normal and breath sounds normal. No stridor.  Genitourinary: Rectum:     No external hemorrhoid.      Musculoskeletal:        General: No edema.  Lymphadenopathy:    He has no cervical adenopathy.  Neurological: He is alert and oriented to person, place, and time.  Skin: Skin is warm and dry. He is not diaphoretic.  Psychiatric: He has a normal mood and affect. His behavior is normal.    BP 138/82   Pulse 85   Temp 98.1 F (36.7 C) (Tympanic)   Ht 5\' 10"  (1.778 m)   Wt (!)  307 lb (139.3 kg)   SpO2 96%   BMI 44.05 kg/m  Wt Readings from Last 3 Encounters:  07/02/19 (!) 307 lb (139.3 kg)  03/07/19 (!) 305 lb (138.3 kg)  01/23/19 (!) 306 lb 6.4 oz (139 kg)     Health Maintenance Due  Topic Date Due  . OPHTHALMOLOGY EXAM  12/07/1980    There are no preventive care reminders to display for this patient.  Lab Results  Component Value Date   TSH 2.84 03/22/2018   Lab Results  Component Value Date   WBC 10.3 07/02/2019   HGB 15.2 07/02/2019   HCT 45.2 07/02/2019   MCV 85.2 07/02/2019   PLT 299.0 07/02/2019   Lab Results  Component Value Date   NA 138 07/02/2019   K 4.1 07/02/2019  CO2 28 07/02/2019   GLUCOSE 115 (H) 07/02/2019   BUN 15 07/02/2019   CREATININE 0.89 07/02/2019   BILITOT 0.5 07/02/2019   ALKPHOS 81 07/02/2019   AST 18 07/02/2019   ALT 34 07/02/2019   PROT 6.9 07/02/2019   ALBUMIN 4.3 07/02/2019   CALCIUM 9.7 07/02/2019   ANIONGAP 9 03/07/2019   GFR 91.02 07/02/2019   Lab Results  Component Value Date   CHOL 165 07/02/2019   Lab Results  Component Value Date   HDL 40.10 07/02/2019   Lab Results  Component Value Date   LDLCALC 85 03/22/2018   Lab Results  Component Value Date   TRIG 252.0 (H) 07/02/2019   Lab Results  Component Value Date   CHOLHDL 4 07/02/2019   Lab Results  Component Value Date   HGBA1C 7.7 (H) 07/02/2019      Assessment & Plan:   Problem List Items Addressed This Visit      Digestive   Hyperplastic rectal polyp   Relevant Orders   Ambulatory referral to Gastroenterology     Endocrine   Controlled type 2 diabetes mellitus without complication, without long-term current use of insulin (HCC)   Relevant Medications   metFORMIN (GLUCOPHAGE XR) 500 MG 24 hr tablet   simvastatin (ZOCOR) 40 MG tablet   Other Relevant Orders   CBC (Completed)   Comprehensive metabolic panel (Completed)   Hemoglobin A1c (Completed)   Urinalysis, Routine w reflex microscopic (Completed)    Microalbumin / creatinine urine ratio (Completed)     Other   Elevated LDL cholesterol level   Relevant Medications   simvastatin (ZOCOR) 40 MG tablet   Other Relevant Orders   LDL cholesterol, direct (Completed)   Lipid panel (Completed)   Vitamin D deficiency   Relevant Medications   ergocalciferol (VITAMIN D2) 1.25 MG (50000 UT) capsule   Other Relevant Orders   VITAMIN D 25 Hydroxy (Vit-D Deficiency, Fractures) (Completed)   Perianal abscess - Primary   Relevant Orders   Ambulatory referral to General Surgery      Meds ordered this encounter  Medications  . metFORMIN (GLUCOPHAGE XR) 500 MG 24 hr tablet    Sig: Take one tablet twice daily    Dispense:  180 tablet    Refill:  1  . simvastatin (ZOCOR) 40 MG tablet    Sig: Take 1 tablet (40 mg total) by mouth at bedtime.    Dispense:  90 tablet    Refill:  3  . ergocalciferol (VITAMIN D2) 1.25 MG (50000 UT) capsule    Sig: Take 1 capsule (50,000 Units total) by mouth once a week.    Dispense:  12 capsule    Refill:  3    Follow-up: Return in about 6 months (around 12/30/2019), or if symptoms worsen or fail to improve, for USE Tucks pads on ulceration after stooling. .  Discussed increasing his Zocor as needed LDL cholesterol results.  [name], MD

## 2019-07-03 LAB — COMPREHENSIVE METABOLIC PANEL
ALT: 34 U/L (ref 0–53)
AST: 18 U/L (ref 0–37)
Albumin: 4.3 g/dL (ref 3.5–5.2)
Alkaline Phosphatase: 81 U/L (ref 39–117)
BUN: 15 mg/dL (ref 6–23)
CO2: 28 mEq/L (ref 19–32)
Calcium: 9.7 mg/dL (ref 8.4–10.5)
Chloride: 101 mEq/L (ref 96–112)
Creatinine, Ser: 0.89 mg/dL (ref 0.40–1.50)
GFR: 91.02 mL/min (ref >60.00)
Glucose, Bld: 115 mg/dL — ABNORMAL HIGH (ref 70–99)
Potassium: 4.1 mEq/L (ref 3.5–5.1)
Sodium: 138 mEq/L (ref 135–145)
Total Bilirubin: 0.5 mg/dL (ref 0.2–1.2)
Total Protein: 6.9 g/dL (ref 6.0–8.3)

## 2019-07-03 LAB — URINALYSIS, ROUTINE W REFLEX MICROSCOPIC
Bilirubin Urine: NEGATIVE
Hgb urine dipstick: NEGATIVE
Ketones, ur: NEGATIVE
Leukocytes,Ua: NEGATIVE
Nitrite: NEGATIVE
RBC / HPF: NONE SEEN (ref 0–?)
Specific Gravity, Urine: 1.025 (ref 1.000–1.030)
Total Protein, Urine: 30 — AB
Urine Glucose: NEGATIVE
Urobilinogen, UA: 0.2 (ref 0.0–1.0)
pH: 6 (ref 5.0–8.0)

## 2019-07-03 LAB — MICROALBUMIN / CREATININE URINE RATIO
Creatinine,U: 171.9 mg/dL
Microalb Creat Ratio: 11.4 mg/g (ref 0.0–30.0)
Microalb, Ur: 19.7 mg/dL — ABNORMAL HIGH (ref 0.0–1.9)

## 2019-07-03 LAB — CBC
HCT: 45.2 % (ref 39.0–52.0)
Hemoglobin: 15.2 g/dL (ref 13.0–17.0)
MCHC: 33.7 g/dL (ref 30.0–36.0)
MCV: 85.2 fl (ref 78.0–100.0)
Platelets: 299 10*3/uL (ref 150.0–400.0)
RBC: 5.3 Mil/uL (ref 4.22–5.81)
RDW: 13.5 % (ref 11.5–15.5)
WBC: 10.3 10*3/uL (ref 4.0–10.5)

## 2019-07-03 LAB — LIPID PANEL
Cholesterol: 165 mg/dL (ref 0–200)
HDL: 40.1 mg/dL (ref >39.00)
NonHDL: 124.65
Total CHOL/HDL Ratio: 4
Triglycerides: 252 mg/dL — ABNORMAL HIGH (ref 0.0–149.0)
VLDL: 50.4 mg/dL — ABNORMAL HIGH (ref 0.0–40.0)

## 2019-07-03 LAB — LDL CHOLESTEROL, DIRECT: Direct LDL: 96 mg/dL

## 2019-07-03 LAB — HEMOGLOBIN A1C: Hgb A1c MFr Bld: 7.7 % — ABNORMAL HIGH (ref 4.6–6.5)

## 2019-07-03 LAB — VITAMIN D 25 HYDROXY (VIT D DEFICIENCY, FRACTURES): VITD: 25.15 ng/mL — ABNORMAL LOW (ref 30.00–100.00)

## 2019-07-05 MED ORDER — ERGOCALCIFEROL 1.25 MG (50000 UT) PO CAPS: 50000.0000 [IU] | ORAL_CAPSULE | ORAL | 3 refills | Status: DC

## 2019-07-05 MED ORDER — METFORMIN HCL ER 500 MG PO TB24: ORAL_TABLET | ORAL | 1 refills | Status: DC

## 2019-07-05 MED ORDER — SIMVASTATIN 40 MG PO TABS: 40.0000 mg | ORAL_TABLET | Freq: Every day | ORAL | 3 refills | Status: DC

## 2019-07-05 NOTE — Addendum Note (Signed)
Addended by: Jon Billings on: 07/05/2019 10:43 AM   Modules accepted: Orders

## 2019-07-07 ENCOUNTER — Other Ambulatory Visit: Payer: Self-pay | Admitting: Family Medicine

## 2019-07-07 DIAGNOSIS — E119 Type 2 diabetes mellitus without complications: Secondary | ICD-10-CM

## 2019-07-25 ENCOUNTER — Encounter: Payer: Self-pay | Admitting: Gastroenterology

## 2019-08-14 ENCOUNTER — Telehealth: Payer: Self-pay | Admitting: *Deleted

## 2019-08-14 NOTE — Telephone Encounter (Signed)
Okay to directly proceed with colonoscopy as scheduled in the [facility]. Thanks

## 2019-08-14 NOTE — Telephone Encounter (Signed)
Dr. Havery Moros,  This pt had a perirectal abscess repair October 2020.  He is coming in for a direct colonoscopy on 09-10-19; his PV is 08-27-19.  Are you ok to continue with direct to [facility] or would you like an OV first?  Thanks, [employer]

## 2019-08-14 NOTE — Telephone Encounter (Signed)
noted 

## 2019-08-27 ENCOUNTER — Ambulatory Visit (AMBULATORY_SURGERY_CENTER): Payer: Self-pay | Admitting: *Deleted

## 2019-08-27 ENCOUNTER — Other Ambulatory Visit: Payer: Self-pay

## 2019-08-27 VITALS — Temp 97.5°F | Ht 70.0 in | Wt 305.0 lb

## 2019-08-27 DIAGNOSIS — K621 Rectal polyp: Secondary | ICD-10-CM

## 2019-08-27 DIAGNOSIS — Z01818 Encounter for other preprocedural examination: Secondary | ICD-10-CM

## 2019-08-27 MED ORDER — SUPREP BOWEL PREP KIT 17.5-3.13-1.6 GM/177ML PO SOLN: 1.0000 | Freq: Once | ORAL | 0 refills | Status: AC

## 2019-08-27 NOTE — Progress Notes (Signed)

## 2019-09-06 ENCOUNTER — Ambulatory Visit (INDEPENDENT_AMBULATORY_CARE_PROVIDER_SITE_OTHER): Payer: BC Managed Care – PPO

## 2019-09-06 ENCOUNTER — Other Ambulatory Visit: Payer: Self-pay | Admitting: Gastroenterology

## 2019-09-06 DIAGNOSIS — Z1159 Encounter for screening for other viral diseases: Secondary | ICD-10-CM

## 2019-09-07 ENCOUNTER — Encounter: Payer: Self-pay | Admitting: Gastroenterology

## 2019-09-07 ENCOUNTER — Telehealth: Payer: Self-pay | Admitting: Family Medicine

## 2019-09-07 LAB — SARS CORONAVIRUS 2 (TAT 6-24 HRS): SARS Coronavirus 2: NEGATIVE

## 2019-09-07 NOTE — Telephone Encounter (Signed)
Patient is calling and wanted to et Dr. Ethelene Hal know that all his prescriptions needs to go to [facility] on [facility] for a 90 day supply. CB is [phone]

## 2019-09-10 ENCOUNTER — Ambulatory Visit (AMBULATORY_SURGERY_CENTER): Payer: BC Managed Care – PPO | Admitting: Gastroenterology

## 2019-09-10 ENCOUNTER — Other Ambulatory Visit: Payer: Self-pay

## 2019-09-10 ENCOUNTER — Encounter: Payer: Self-pay | Admitting: Gastroenterology

## 2019-09-10 DIAGNOSIS — Z1211 Encounter for screening for malignant neoplasm of colon: Secondary | ICD-10-CM

## 2019-09-10 DIAGNOSIS — D122 Benign neoplasm of ascending colon: Secondary | ICD-10-CM | POA: Diagnosis not present

## 2019-09-10 DIAGNOSIS — D127 Benign neoplasm of rectosigmoid junction: Secondary | ICD-10-CM

## 2019-09-10 DIAGNOSIS — D120 Benign neoplasm of cecum: Secondary | ICD-10-CM

## 2019-09-10 MED ORDER — SODIUM CHLORIDE 0.9 % IV SOLN: 500.0000 mL | Freq: Once | INTRAVENOUS | Status: DC

## 2019-09-10 NOTE — Op Note (Signed)
[facility] Patient Name: Chalmus Vandijk Procedure Date: 09/10/2019 9:14 AM MRN: [id number] Endoscopist: Remo Lipps P. Havery Moros , MD Age: 49 Referring MD:  Date of Birth: [birth year] Gender: Male Account #: [id number] Procedure:                Colonoscopy Indications:              Screening for colorectal malignant neoplasm, This                            is the patient's first colonoscopy - recent surgery                            in October for hemorrhoidectomy, benign                            hyperplastic polyp also removed Medicines:                Monitored Anesthesia Care Procedure:                Pre-Anesthesia Assessment:                           - Prior to the procedure, a History and Physical                            was performed, and patient medications and                            allergies were reviewed. The patient's tolerance of                            previous anesthesia was also reviewed. The risks                            and benefits of the procedure and the sedation                            options and risks were discussed with the patient.                            All questions were answered, and informed consent                            was obtained. Prior Anticoagulants: The patient has                            taken no previous anticoagulant or antiplatelet                            agents. ASA Grade Assessment: III - A patient with                            severe systemic disease. After reviewing the risks  and benefits, the patient was deemed in                            satisfactory condition to undergo the procedure.                           After obtaining informed consent, the colonoscope                            was passed under direct vision. Throughout the                            procedure, the patient's blood pressure, pulse, and                            oxygen saturations were  monitored continuously. The                            Colonoscope was introduced through the anus and                            advanced to the the terminal ileum, with                            identification of the appendiceal orifice and IC                            valve. The colonoscopy was performed without                            difficulty. The patient tolerated the procedure                            well. The quality of the bowel preparation was                            good. The ileocecal valve, appendiceal orifice, and                            rectum were photographed. Scope In: 9:22:37 AM Scope Out: 9:40:05 AM Scope Withdrawal Time: 0 hours 13 minutes 54 seconds  Total Procedure Duration: 0 hours 17 minutes 28 seconds  Findings:                 The digital rectal exam findings include perianal                            scarring from prior surgery.                           The terminal ileum appeared normal.                           A single small angiodysplastic lesion was found in  the ascending colon.                           Two sessile polyps were found in the cecum. The                            polyps were 3 mm in size. These polyps were removed                            with a cold snare. Resection and retrieval were                            complete.                           Two sessile polyps were found in the ascending                            colon. The polyps were 3 to 4 mm in size. These                            polyps were removed with a cold snare. Resection                            and retrieval were complete.                           Two sessile polyps were found in the recto-sigmoid                            colon. The polyps were 4 to 5 mm in size. These                            polyps were removed with a cold snare. Resection                            and retrieval were complete.                            Internal hemorrhoids were found during retroflexion.                           The exam was otherwise without abnormality. Complications:            No immediate complications. Estimated blood loss:                            Minimal. Estimated Blood Loss:     Estimated blood loss was minimal. Impression:               - Perianal scarring from prior surgery. found on                            digital rectal exam.                           -  The examined portion of the ileum was normal.                           - A single colonic angiodysplastic lesion.                           - Two 3 mm polyps in the cecum, removed with a cold                            snare. Resected and retrieved.                           - Two 3 to 4 mm polyps in the ascending colon,                            removed with a cold snare. Resected and retrieved.                           - Two 4 to 5 mm polyps at the recto-sigmoid colon,                            removed with a cold snare. Resected and retrieved.                           - Internal hemorrhoids.                           - The examination was otherwise normal. Recommendation:           - Patient has a contact number available for                            emergencies. The signs and symptoms of potential                            delayed complications were discussed with the                            patient. Return to normal activities tomorrow.                            Written discharge instructions were provided to the                            patient.                           - Resume previous diet.                           - Continue present medications.                           - Await pathology results. Remo Lipps P. Aury Scollard, MD 09/10/2019 9:47:23 AM This report has been signed electronically.

## 2019-09-10 NOTE — Progress Notes (Signed)
A/ox3, pleased with MAC, report to RN 

## 2019-09-10 NOTE — Patient Instructions (Signed)
6 polyps found removed and sent to pathology.  Internal Hemorroids.  Await pathology for final recommendations.  Handouts on findings given to patient.    YOU HAD AN ENDOSCOPIC PROCEDURE TODAY AT [facility]:   Refer to the procedure report that was given to you for any specific questions about what was found during the examination.  If the procedure report does not answer your questions, please call your gastroenterologist to clarify.  If you requested that your care partner not be given the details of your procedure findings, then the procedure report has been included in a sealed envelope for you to review at your convenience later.  YOU SHOULD EXPECT: Some feelings of bloating in the abdomen. Passage of more gas than usual.  Walking can help get rid of the air that was put into your GI tract during the procedure and reduce the bloating. If you had a lower endoscopy (such as a colonoscopy or flexible sigmoidoscopy) you may notice spotting of blood in your stool or on the toilet paper. If you underwent a bowel prep for your procedure, you may not have a normal bowel movement for a few days.  Please Note:  You might notice some irritation and congestion in your nose or some drainage.  This is from the oxygen used during your procedure.  There is no need for concern and it should clear up in a day or so.  SYMPTOMS TO REPORT IMMEDIATELY:   Following lower endoscopy (colonoscopy or flexible sigmoidoscopy):  Excessive amounts of blood in the stool  Significant tenderness or worsening of abdominal pains  Swelling of the abdomen that is new, acute  Fever of 100F or higher   For urgent or emergent issues, a gastroenterologist can be reached at any hour by calling [phone]. Do not use MyChart messaging for urgent concerns.    DIET:  We do recommend a small meal at first, but then you may proceed to your regular diet.  Drink plenty of fluids but you should avoid alcoholic  beverages for 24 hours.  ACTIVITY:  You should plan to take it easy for the rest of today and you should NOT DRIVE or use heavy machinery until tomorrow (because of the sedation medicines used during the test).    FOLLOW UP: Our staff will call the number listed on your records 48-72 hours following your procedure to check on you and address any questions or concerns that you may have regarding the information given to you following your procedure. If we do not reach you, we will leave a message.  We will attempt to reach you two times.  During this call, we will ask if you have developed any symptoms of COVID 19. If you develop any symptoms (ie: fever, flu-like symptoms, shortness of breath, cough etc.) before then, please call [phone].  If you test positive for Covid 19 in the 2 weeks post procedure, please call and report this information to [place].    If any biopsies were taken you will be contacted by phone or by letter within the next 1-3 weeks.  Please call us at [phone] if you have not heard about the biopsies in 3 weeks.    SIGNATURES/CONFIDENTIALITY: You and/or your care partner have signed paperwork which will be entered into your electronic medical record.  These signatures attest to the fact that that the information above on your After Visit Summary has been reviewed and is understood.  Full responsibility of the confidentiality of  this discharge information lies with you and/or your care-partner.

## 2019-09-10 NOTE — Progress Notes (Signed)
Temp by JB Vitals by DT  Pt's states no medical or surgical changes since previsit or office visit. 

## 2019-09-10 NOTE — Progress Notes (Signed)
Called to room to assist during endoscopic procedure.  Patient ID and intended procedure confirmed with present staff. Received instructions for my participation in the procedure from the performing physician.verbal order received in procedure room to change dx. To screening.

## 2019-09-12 ENCOUNTER — Telehealth: Payer: Self-pay

## 2019-09-12 ENCOUNTER — Other Ambulatory Visit: Payer: Self-pay

## 2019-09-12 DIAGNOSIS — E119 Type 2 diabetes mellitus without complications: Secondary | ICD-10-CM

## 2019-09-12 DIAGNOSIS — E7800 Pure hypercholesterolemia, unspecified: Secondary | ICD-10-CM

## 2019-09-12 DIAGNOSIS — E559 Vitamin D deficiency, unspecified: Secondary | ICD-10-CM

## 2019-09-12 MED ORDER — ERGOCALCIFEROL 1.25 MG (50000 UT) PO CAPS: 50000.0000 [IU] | ORAL_CAPSULE | ORAL | 3 refills | Status: DC

## 2019-09-12 MED ORDER — METFORMIN HCL ER 500 MG PO TB24: ORAL_TABLET | ORAL | 3 refills | Status: DC

## 2019-09-12 MED ORDER — SIMVASTATIN 40 MG PO TABS: 40.0000 mg | ORAL_TABLET | Freq: Every day | ORAL | 3 refills | Status: DC

## 2019-09-12 NOTE — Telephone Encounter (Signed)
  Follow up Call-  Call back number 09/10/2019  Post procedure Call Back phone  # [phone]  Permission to leave phone message Yes  Some recent data might be hidden     Patient questions:  Do you have a fever, pain , or abdominal swelling? No. Pain Score  0 *  Have you tolerated food without any problems? Yes.    Have you been able to return to your normal activities? Yes.    Do you have any questions about your discharge instructions: Diet   No. Medications  No. Follow up visit  No.  Do you have questions or concerns about your Care? No.  Actions: * If pain score is 4 or above: No action needed, pain <4.  1. Have you developed a fever since your procedure? no  2.   Have you had an respiratory symptoms (SOB or cough) since your procedure? no  3.   Have you tested positive for COVID 19 since your procedure no  4.   Have you had any family members/close contacts diagnosed with the COVID 19 since your procedure?  no   If yes to any of these questions please route to Joylene John, RN and Erenest Rasher, RN

## 2019-09-12 NOTE — Telephone Encounter (Signed)
Rx sent in and pharmacy switched.

## 2019-09-28 ENCOUNTER — Other Ambulatory Visit: Payer: Self-pay

## 2019-09-28 DIAGNOSIS — E7800 Pure hypercholesterolemia, unspecified: Secondary | ICD-10-CM

## 2019-09-28 MED ORDER — SIMVASTATIN 40 MG PO TABS: 40.0000 mg | ORAL_TABLET | Freq: Every day | ORAL | 3 refills | Status: DC

## 2019-09-28 NOTE — Telephone Encounter (Signed)
Patient states [facility] has other prescriptions but did not receive simvastatin. Please resend simvastatin.

## 2019-09-28 NOTE — Telephone Encounter (Signed)
Rx resent.

## 2019-09-28 NOTE — Telephone Encounter (Signed)
Okay 

## 2019-11-20 DIAGNOSIS — E119 Type 2 diabetes mellitus without complications: Secondary | ICD-10-CM | POA: Diagnosis not present

## 2019-11-20 LAB — HM DIABETES EYE EXAM

## 2020-01-04 ENCOUNTER — Ambulatory Visit: Payer: BC Managed Care – PPO | Admitting: Family Medicine

## 2020-01-04 ENCOUNTER — Encounter: Payer: Self-pay | Admitting: Family Medicine

## 2020-01-04 ENCOUNTER — Other Ambulatory Visit: Payer: Self-pay

## 2020-01-04 VITALS — BP 132/70 | HR 83 | Temp 97.0°F | Ht 70.0 in | Wt 305.0 lb

## 2020-01-04 DIAGNOSIS — E559 Vitamin D deficiency, unspecified: Secondary | ICD-10-CM

## 2020-01-04 DIAGNOSIS — E7800 Pure hypercholesterolemia, unspecified: Secondary | ICD-10-CM | POA: Diagnosis not present

## 2020-01-04 DIAGNOSIS — E119 Type 2 diabetes mellitus without complications: Secondary | ICD-10-CM

## 2020-01-04 MED ORDER — METFORMIN HCL ER 500 MG PO TB24: ORAL_TABLET | ORAL | 3 refills | Status: DC

## 2020-01-04 MED ORDER — SIMVASTATIN 40 MG PO TABS: 40.0000 mg | ORAL_TABLET | Freq: Every day | ORAL | 3 refills | Status: DC

## 2020-01-04 NOTE — Patient Instructions (Signed)
Semaglutide injection solution What is this medicine? SEMAGLUTIDE (Sem a GLOO tide) is used to improve blood sugar control in adults with type 2 diabetes. This medicine may be used with other diabetes medicines. This drug may also reduce the risk of heart attack or stroke if you have type 2 diabetes and risk factors for heart disease. This medicine may be used for other purposes; ask your health care provider or pharmacist if you have questions. COMMON BRAND NAME(S): OZEMPIC What should I tell my health care provider before I take this medicine? They need to know if you have any of these conditions:  endocrine tumors (MEN 2) or if someone in your family had these tumors  eye disease, vision problems  history of pancreatitis  kidney disease  stomach problems  thyroid cancer or if someone in your family had thyroid cancer  an unusual or allergic reaction to semaglutide, other medicines, foods, dyes, or preservatives  pregnant or trying to get pregnant  breast-feeding How should I use this medicine? This medicine is for injection under the skin of your upper leg (thigh), stomach area, or upper arm. It is given once every week (every 7 days). You will be taught how to prepare and give this medicine. Use exactly as directed. Take your medicine at regular intervals. Do not take it more often than directed. If you use this medicine with insulin, you should inject this medicine and the insulin separately. Do not mix them together. Do not give the injections right next to each other. Change (rotate) injection sites with each injection. It is important that you put your used needles and syringes in a special sharps container. Do not put them in a trash can. If you do not have a sharps container, call your pharmacist or healthcare provider to get one. A special MedGuide will be given to you by the pharmacist with each prescription and refill. Be sure to read this information carefully each  time. This drug comes with INSTRUCTIONS FOR USE. Ask your pharmacist for directions on how to use this drug. Read the information carefully. Talk to your pharmacist or health care provider if you have questions. Talk to your pediatrician regarding the use of this medicine in children. Special care may be needed. Overdosage: If you think you have taken too much of this medicine contact a poison control center or emergency room at once. NOTE: This medicine is only for you. Do not share this medicine with others. What if I miss a dose? If you miss a dose, take it as soon as you can within 5 days after the missed dose. Then take your next dose at your regular weekly time. If it has been longer than 5 days after the missed dose, do not take the missed dose. Take the next dose at your regular time. Do not take double or extra doses. If you have questions about a missed dose, contact your health care provider for advice. What may interact with this medicine?  other medicines for diabetes Many medications may cause changes in blood sugar, these include:  alcohol containing beverages  antiviral medicines for HIV or AIDS  aspirin and aspirin-like drugs  certain medicines for blood pressure, heart disease, irregular heart beat  chromium  diuretics  male hormones, such as estrogens or progestins, birth control pills  fenofibrate  gemfibrozil  isoniazid  lanreotide  male hormones or anabolic steroids  MAOIs like Carbex, Eldepryl, Marplan, Nardil, and Parnate  medicines for weight loss  medicines for   allergies, asthma, cold, or cough  medicines for depression, anxiety, or psychotic disturbances  niacin  nicotine  NSAIDs, medicines for pain and inflammation, like ibuprofen or naproxen  octreotide  pasireotide  pentamidine  phenytoin  probenecid  quinolone antibiotics such as ciprofloxacin, levofloxacin, ofloxacin  some herbal dietary supplements  steroid medicines  such as prednisone or cortisone  sulfamethoxazole; trimethoprim  thyroid hormones Some medications can hide the warning symptoms of low blood sugar (hypoglycemia). You may need to monitor your blood sugar more closely if you are taking one of these medications. These include:  beta-blockers, often used for high blood pressure or heart problems (examples include atenolol, metoprolol, propranolol)  clonidine  guanethidine  reserpine This list may not describe all possible interactions. Give your health care provider a list of all the medicines, herbs, non-prescription drugs, or dietary supplements you use. Also tell them if you smoke, drink alcohol, or use illegal drugs. Some items may interact with your medicine. What should I watch for while using this medicine? Visit your doctor or health care professional for regular checks on your progress. Drink plenty of fluids while taking this medicine. Check with your doctor or health care professional if you get an attack of severe diarrhea, nausea, and vomiting. The loss of too much body fluid can make it dangerous for you to take this medicine. A test called the HbA1C (A1C) will be monitored. This is a simple blood test. It measures your blood sugar control over the last 2 to 3 months. You will receive this test every 3 to 6 months. Learn how to check your blood sugar. Learn the symptoms of low and high blood sugar and how to manage them. Always carry a quick-source of sugar with you in case you have symptoms of low blood sugar. Examples include hard sugar candy or glucose tablets. Make sure others know that you can choke if you eat or drink when you develop serious symptoms of low blood sugar, such as seizures or unconsciousness. They must get medical help at once. Tell your doctor or health care professional if you have high blood sugar. You might need to change the dose of your medicine. If you are sick or exercising more than usual, you might need  to change the dose of your medicine. Do not skip meals. Ask your doctor or health care professional if you should avoid alcohol. Many nonprescription cough and cold products contain sugar or alcohol. These can affect blood sugar. Pens should never be shared. Even if the needle is changed, sharing may result in passing of viruses like hepatitis or HIV. Wear a medical ID bracelet or chain, and carry a card that describes your disease and details of your medicine and dosage times. Do not become pregnant while taking this medicine. Women should inform their doctor if they wish to become pregnant or think they might be pregnant. There is a potential for serious side effects to an unborn child. Talk to your health care professional or pharmacist for more information. What side effects may I notice from receiving this medicine? Side effects that you should report to your doctor or health care professional as soon as possible:  allergic reactions like skin rash, itching or hives, swelling of the face, lips, or tongue  breathing problems  changes in vision  diarrhea that continues or is severe  lump or swelling on the neck  severe nausea  signs and symptoms of infection like fever or chills; cough; sore throat; pain or trouble   passing urine  signs and symptoms of low blood sugar such as feeling anxious, confusion, dizziness, increased hunger, unusually weak or tired, sweating, shakiness, cold, irritable, headache, blurred vision, fast heartbeat, loss of consciousness  signs and symptoms of kidney injury like trouble passing urine or change in the amount of urine  trouble swallowing  unusual stomach upset or pain  vomiting Side effects that usually do not require medical attention (report to your doctor or health care professional if they continue or are bothersome):  constipation  diarrhea  nausea  pain, redness, or irritation at site where injected  stomach upset This list may not  describe all possible side effects. Call your doctor for medical advice about side effects. You may report side effects to FDA at 1-800-FDA-1088. Where should I keep my medicine? Keep out of the reach of children. Store unopened pens in a refrigerator between 2 and 8 degrees C (36 and 46 degrees F). Do not freeze. Protect from light and heat. After you first use the pen, it can be stored for 56 days at room temperature between 15 and 30 degrees C (59 and 86 degrees F) or in a refrigerator. Throw away your used pen after 56 days or after the expiration date, whichever comes first. Do not store your pen with the needle attached. If the needle is left on, medicine may leak from the pen. NOTE: This sheet is a summary. It may not cover all possible information. If you have questions about this medicine, talk to your doctor, pharmacist, or health care provider.  2020 Elsevier/Gold Standard (2019-01-16 09:41:51)  

## 2020-01-04 NOTE — Progress Notes (Signed)
Established Patient Office Visit  Subjective:  Patient ID: Jason Waters, male    DOB: [birth year]  Age: 49 y.o. MRN: [id number]  CC:  Chief Complaint  Patient presents with  . Follow-up    6 month follow up on diabetes. Patient states that he feels like his medications is causing diarrhea with bad stomach cramps also sweating a lot.     HPI Jason Waters presents for follow-up of diabetes, elevated cholesterol and vitamin D deficiency.  Continues on medicine as directed.  Recently has experienced abdominal cramps after walking.  Wonders if it could be the Metformin.  He has taken it for over a year now without this issue.  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  He does try to hydrate well with copious water but I explained that it is sometimes difficult to keep up when exposed to the heat.  Status post normal eye check few months ago.  Past Medical History:  Diagnosis Date  . Diabetes mellitus without complication ([place])   . Hyperlipidemia   . Hyperplastic rectal polyp   . Medical history non-contributory   . Neuromuscular disorder ([place])    left arm goes to sleep frequently   . Perirectal fistula     Past Surgical History:  Procedure Laterality Date  . HAND SURGERY Left   . LUMBAR LAMINECTOMY/DECOMPRESSION MICRODISCECTOMY Right 07/09/2015   Procedure: RIGHT SIDED LUMBAR 5-SACRUM 1 MICRODISECTOMY;  Surgeon: Phylliss Bob, MD;  Location: [facility];  Service: Orthopedics;  Laterality: Right;  Right sided lumbar 5-sacrum 1 microdisectomy  . POLYPECTOMY  02/2019   [facility] rectal   . RECTAL EXAM UNDER ANESTHESIA N/A 03/15/2019   Procedure: ANORECTAL EXAM UNDER ANESTHESIA, REPAIR OF PERIRECTAL FISTULA, POSSIBLE HEMORROIDECTOMY;  Surgeon: Michael Boston, MD;  Location: [facility];  Service: General;  Laterality: N/A;  . WISDOM TOOTH EXTRACTION      Family History  Problem Relation Age of Onset  . Diabetes Father   . Heart disease Father   . Esophageal cancer  Maternal Grandmother   . Colon cancer Neg Hx   . Colon polyps Neg Hx   . Rectal cancer Neg Hx   . Stomach cancer Neg Hx     Social History   Socioeconomic History  . Marital status: Married    Spouse name: Not on file  . Number of children: Not on file  . Years of education: Not on file  . Highest education level: Not on file  Occupational History  . Not on file  Tobacco Use  . Smoking status: Never Smoker  . Smokeless tobacco: Never Used  Vaping Use  . Vaping Use: Never used  Substance and Sexual Activity  . Alcohol use: Yes    Comment: rarely  . Drug use: No  . Sexual activity: Yes    Birth control/protection: None  Other Topics Concern  . Not on file  Social History Narrative  . Not on file   Social Determinants of Health   Financial Resource Strain:   . Difficulty of Paying Living Expenses: Not on file  Food Insecurity:   . Worried About Charity fundraiser in the Last Year: Not on file  . Ran Out of Food in the Last Year: Not on file  Transportation Needs:   . Lack of Transportation (Medical): Not on file  . Lack of Transportation (Non-Medical): Not on file  Physical Activity:   . Days of Exercise per Week: Not on  file  . Minutes of Exercise per Session: Not on file  Stress:   . Feeling of Stress : Not on file  Social Connections:   . Frequency of Communication with Friends and Family: Not on file  . Frequency of Social Gatherings with Friends and Family: Not on file  . Attends Religious Services: Not on file  . Active Member of Clubs or Organizations: Not on file  . Attends Archivist Meetings: Not on file  . Marital Status: Not on file  Intimate Partner Violence:   . Fear of Current or Ex-Partner: Not on file  . Emotionally Abused: Not on file  . Physically Abused: Not on file  . Sexually Abused: Not on file    Outpatient Medications Prior to Visit  Medication Sig Dispense Refill  . ergocalciferol (VITAMIN D2) 1.25 MG (50000 UT) capsule  Take 1 capsule (50,000 Units total) by mouth once a week. 12 capsule 3  . metFORMIN (GLUCOPHAGE XR) 500 MG 24 hr tablet Take one tablet twice daily 180 tablet 3  . simvastatin (ZOCOR) 40 MG tablet Take 1 tablet (40 mg total) by mouth at bedtime. 90 tablet 3  . oxyCODONE (OXY IR/ROXICODONE) 5 MG immediate release tablet Take 1-2 tablets (5-10 mg total) by mouth every 6 (six) hours as needed for moderate pain, severe pain or breakthrough pain. (Patient not taking: Reported on 07/02/2019) 30 tablet 0   Facility-Administered Medications Prior to Visit  Medication Dose Route Frequency Provider Last Rate Last Admin  . 0.9 %  sodium chloride infusion  500 mL Intravenous Once Armbruster, Carlota Raspberry, MD      . 0.9 %  sodium chloride infusion  500 mL Intravenous Once Armbruster, Carlota Raspberry, MD        Allergies  Allergen Reactions  . Bee Venom Swelling    ROS Review of Systems  Constitutional: Negative.   HENT: Negative.   Eyes: Negative for photophobia and visual disturbance.  Respiratory: Negative.   Cardiovascular: Negative.   Gastrointestinal: Negative.   Endocrine: Negative for polyphagia and polyuria.  Genitourinary: Negative for difficulty urinating, frequency and urgency.  Musculoskeletal: Negative for arthralgias and myalgias.  Skin: Negative for pallor and rash.  Allergic/Immunologic: Negative for immunocompromised state.  Neurological: Negative for weakness and numbness.  Hematological: Does not bruise/bleed easily.  Psychiatric/Behavioral: Negative.       Objective:    Physical Exam Vitals and nursing note reviewed.  Constitutional:      General: He is not in acute distress.    Appearance: Normal appearance. He is obese. He is not ill-appearing or toxic-appearing.  HENT:     Head: Normocephalic and atraumatic.     Right Ear: Tympanic membrane, ear canal and external ear normal.     Left Ear: Tympanic membrane, ear canal and external ear normal.     Mouth/Throat:     Mouth:  Mucous membranes are moist.     Pharynx: Oropharynx is clear. No oropharyngeal exudate or posterior oropharyngeal erythema.  Eyes:     General: No scleral icterus.       Right eye: No discharge.        Left eye: No discharge.     Conjunctiva/sclera: Conjunctivae normal.     Pupils: Pupils are equal, round, and reactive to light.  Cardiovascular:     Rate and Rhythm: Normal rate and regular rhythm.  Pulmonary:     Effort: Pulmonary effort is normal.     Breath sounds: Normal breath sounds.  Abdominal:  General: Bowel sounds are normal.  Musculoskeletal:     Cervical back: No rigidity or tenderness.  Lymphadenopathy:     Cervical: No cervical adenopathy.  Neurological:     Mental Status: He is alert and oriented to person, place, and time.  Psychiatric:        Mood and Affect: Mood normal.        Behavior: Behavior normal.     BP 132/70   Pulse 83   Temp (!) 97 F (36.1 C) (Tympanic)   Ht 5\' 10"  (1.778 m)   Wt (!) 305 lb (138.3 kg)   SpO2 96%   BMI 43.76 kg/m  Wt Readings from Last 3 Encounters:  01/04/20 (!) 305 lb (138.3 kg)  09/10/19 (!) 305 lb (138.3 kg)  08/27/19 (!) 305 lb (138.3 kg)     Health Maintenance Due  Topic Date Due  . Hepatitis C Screening  Never done  . OPHTHALMOLOGY EXAM  Never done  . INFLUENZA VACCINE  12/16/2019  . FOOT EXAM  12/28/2019  . HEMOGLOBIN A1C  12/30/2019    There are no preventive care reminders to display for this patient.  Lab Results  Component Value Date   TSH 2.84 03/22/2018   Lab Results  Component Value Date   WBC 10.3 07/02/2019   HGB 15.2 07/02/2019   HCT 45.2 07/02/2019   MCV 85.2 07/02/2019   PLT 299.0 07/02/2019   Lab Results  Component Value Date   NA 138 07/02/2019   K 4.1 07/02/2019   CO2 28 07/02/2019   GLUCOSE 115 (H) 07/02/2019   BUN 15 07/02/2019   CREATININE 0.89 07/02/2019   BILITOT 0.5 07/02/2019   ALKPHOS 81 07/02/2019   AST 18 07/02/2019   ALT 34 07/02/2019   PROT 6.9 07/02/2019    ALBUMIN 4.3 07/02/2019   CALCIUM 9.7 07/02/2019   ANIONGAP 9 03/07/2019   GFR 91.02 07/02/2019   Lab Results  Component Value Date   CHOL 165 07/02/2019   Lab Results  Component Value Date   HDL 40.10 07/02/2019   Lab Results  Component Value Date   LDLCALC 85 03/22/2018   Lab Results  Component Value Date   TRIG 252.0 (H) 07/02/2019   Lab Results  Component Value Date   CHOLHDL 4 07/02/2019   Lab Results  Component Value Date   HGBA1C 7.7 (H) 07/02/2019      Assessment & Plan:   Problem List Items Addressed This Visit      Endocrine   Controlled type 2 diabetes mellitus without complication, without long-term current use of insulin (HCC)   Relevant Medications   metFORMIN (GLUCOPHAGE XR) 500 MG 24 hr tablet   simvastatin (ZOCOR) 40 MG tablet   Other Relevant Orders   CBC   Comprehensive metabolic panel   Hemoglobin A1c   Urinalysis, Routine w reflex microscopic     Other   Elevated LDL cholesterol level   Relevant Medications   simvastatin (ZOCOR) 40 MG tablet   Other Relevant Orders   Comprehensive metabolic panel   LDL cholesterol, direct   Vitamin D deficiency - Primary   Relevant Orders   VITAMIN D 25 Hydroxy (Vit-D Deficiency, Fractures)      Meds ordered this encounter  Medications  . metFORMIN (GLUCOPHAGE XR) 500 MG 24 hr tablet    Sig: Take one tablet twice daily    Dispense:  180 tablet    Refill:  3  . simvastatin (ZOCOR) 40 MG tablet  Sig: Take 1 tablet (40 mg total) by mouth at bedtime.    Dispense:  90 tablet    Refill:  3    Follow-up: Return in about 6 months (around 07/06/2020), or Try to lose some weight. Look over [place] and let me know.[name], MD

## 2020-01-05 LAB — CBC
HCT: 45.3 % (ref 38.5–50.0)
Hemoglobin: 15.5 g/dL (ref 13.2–17.1)
MCH: 29 pg (ref 27.0–33.0)
MCHC: 34.2 g/dL (ref 32.0–36.0)
MCV: 84.8 fL (ref 80.0–100.0)
MPV: 9.9 fL (ref 7.5–12.5)
Platelets: 284 10*3/uL (ref 140–400)
RBC: 5.34 10*6/uL (ref 4.20–5.80)
RDW: 12.8 % (ref 11.0–15.0)
WBC: 11.2 10*3/uL — ABNORMAL HIGH (ref 3.8–10.8)

## 2020-01-05 LAB — COMPREHENSIVE METABOLIC PANEL
AG Ratio: 2 (calc) (ref 1.0–2.5)
ALT: 55 U/L — ABNORMAL HIGH (ref 9–46)
AST: 31 U/L (ref 10–40)
Albumin: 4.5 g/dL (ref 3.6–5.1)
Alkaline phosphatase (APISO): 75 U/L (ref 36–130)
BUN: 16 mg/dL (ref 7–25)
CO2: 25 mmol/L (ref 20–32)
Calcium: 9.8 mg/dL (ref 8.6–10.3)
Chloride: 102 mmol/L (ref 98–110)
Creat: 0.95 mg/dL (ref 0.60–1.35)
Globulin: 2.2 g/dL (calc) (ref 1.9–3.7)
Glucose, Bld: 121 mg/dL — ABNORMAL HIGH (ref 65–99)
Potassium: 4.1 mmol/L (ref 3.5–5.3)
Sodium: 138 mmol/L (ref 135–146)
Total Bilirubin: 0.6 mg/dL (ref 0.2–1.2)
Total Protein: 6.7 g/dL (ref 6.1–8.1)

## 2020-01-05 LAB — LDL CHOLESTEROL, DIRECT: Direct LDL: 76 mg/dL (ref <100)

## 2020-01-05 LAB — HEMOGLOBIN A1C
Hgb A1c MFr Bld: 8.1 % of total Hgb — ABNORMAL HIGH (ref <5.7)
Mean Plasma Glucose: 186 (calc)
eAG (mmol/L): 10.3 (calc)

## 2020-01-05 LAB — VITAMIN D 25 HYDROXY (VIT D DEFICIENCY, FRACTURES): Vit D, 25-Hydroxy: 32 ng/mL (ref 30–100)

## 2020-01-07 ENCOUNTER — Telehealth: Payer: Self-pay | Admitting: Family Medicine

## 2020-01-07 MED ORDER — SEMAGLUTIDE (1 MG/DOSE) 4 MG/3ML ~~LOC~~ SOPN: 1.0000 mg | PEN_INJECTOR | SUBCUTANEOUS | 2 refills | Status: DC

## 2020-01-07 NOTE — Telephone Encounter (Signed)
Patient is returning a call to the office. States it's pertaining to a change in his medications. Please give him a call back at [phone].

## 2020-01-07 NOTE — Addendum Note (Signed)
Addended by: Jon Billings on: 01/07/2020 01:16 PM   Modules accepted: Orders

## 2020-01-07 NOTE — Telephone Encounter (Signed)
Spoke with patient wanting to know if he should continue taking older BP medication with new one.

## 2020-01-08 NOTE — Telephone Encounter (Signed)
Error

## 2020-02-04 ENCOUNTER — Telehealth: Payer: Self-pay | Admitting: Family Medicine

## 2020-02-04 DIAGNOSIS — E119 Type 2 diabetes mellitus without complications: Secondary | ICD-10-CM

## 2020-02-04 DIAGNOSIS — E559 Vitamin D deficiency, unspecified: Secondary | ICD-10-CM

## 2020-02-04 DIAGNOSIS — E7800 Pure hypercholesterolemia, unspecified: Secondary | ICD-10-CM

## 2020-02-04 MED ORDER — SEMAGLUTIDE (1 MG/DOSE) 4 MG/3ML ~~LOC~~ SOPN: 1.0000 mg | PEN_INJECTOR | SUBCUTANEOUS | 2 refills | Status: DC

## 2020-02-04 MED ORDER — SIMVASTATIN 40 MG PO TABS: 40.0000 mg | ORAL_TABLET | Freq: Every day | ORAL | 3 refills | Status: DC

## 2020-02-04 MED ORDER — METFORMIN HCL ER 500 MG PO TB24: ORAL_TABLET | ORAL | 3 refills | Status: DC

## 2020-02-04 NOTE — Telephone Encounter (Signed)
Patient needs written prescriptions for ALL of his medications (due to his insurance) so that he can take them to [facility]. He needs prescriptions written for 90 days. Please call him at [phone] if you have any questions.

## 2020-02-04 NOTE — Telephone Encounter (Signed)
Patient aware that prescriptions are printed off signed and ready for pick up.

## 2020-02-14 DIAGNOSIS — J029 Acute pharyngitis, unspecified: Secondary | ICD-10-CM | POA: Diagnosis not present

## 2020-02-14 DIAGNOSIS — Z20828 Contact with and (suspected) exposure to other viral communicable diseases: Secondary | ICD-10-CM | POA: Diagnosis not present

## 2020-02-14 DIAGNOSIS — R05 Cough: Secondary | ICD-10-CM | POA: Diagnosis not present

## 2020-02-15 DIAGNOSIS — U071 COVID-19: Secondary | ICD-10-CM | POA: Diagnosis not present

## 2020-04-29 ENCOUNTER — Other Ambulatory Visit: Payer: Self-pay | Admitting: Family Medicine

## 2020-04-29 DIAGNOSIS — E119 Type 2 diabetes mellitus without complications: Secondary | ICD-10-CM

## 2020-06-25 ENCOUNTER — Telehealth: Payer: Self-pay | Admitting: Family Medicine

## 2020-06-25 ENCOUNTER — Other Ambulatory Visit: Payer: Self-pay

## 2020-06-25 DIAGNOSIS — E119 Type 2 diabetes mellitus without complications: Secondary | ICD-10-CM

## 2020-06-25 DIAGNOSIS — E559 Vitamin D deficiency, unspecified: Secondary | ICD-10-CM

## 2020-06-25 DIAGNOSIS — E7800 Pure hypercholesterolemia, unspecified: Secondary | ICD-10-CM

## 2020-06-25 MED ORDER — SIMVASTATIN 40 MG PO TABS: 40.0000 mg | ORAL_TABLET | Freq: Every day | ORAL | 3 refills | Status: DC

## 2020-06-25 MED ORDER — ERGOCALCIFEROL 1.25 MG (50000 UT) PO CAPS: 50000.0000 [IU] | ORAL_CAPSULE | ORAL | 3 refills | Status: DC

## 2020-06-25 MED ORDER — OZEMPIC (1 MG/DOSE) 4 MG/3ML ~~LOC~~ SOPN: PEN_INJECTOR | SUBCUTANEOUS | 2 refills | Status: DC

## 2020-06-25 MED ORDER — METFORMIN HCL ER 500 MG PO TB24: ORAL_TABLET | ORAL | 3 refills | Status: DC

## 2020-06-25 NOTE — Telephone Encounter (Signed)
Refills sent in patient aware also agrees to call back to schedule follow up appointment

## 2020-06-25 NOTE — Telephone Encounter (Signed)
Patient is needing all of his prescriptions written for 90 days because of insurance. He needs them sent [facility] on [address]. Please call him at  [phone] if you have any questions (okay to leave a detailed message).  Please advise if appointment needed.

## 2020-06-26 ENCOUNTER — Other Ambulatory Visit: Payer: Self-pay | Admitting: Family

## 2020-06-26 DIAGNOSIS — E119 Type 2 diabetes mellitus without complications: Secondary | ICD-10-CM

## 2020-11-05 DIAGNOSIS — S93401A Sprain of unspecified ligament of right ankle, initial encounter: Secondary | ICD-10-CM | POA: Diagnosis not present

## 2021-02-13 ENCOUNTER — Other Ambulatory Visit: Payer: Self-pay | Admitting: Family Medicine

## 2021-02-13 DIAGNOSIS — E7800 Pure hypercholesterolemia, unspecified: Secondary | ICD-10-CM

## 2021-02-17 ENCOUNTER — Other Ambulatory Visit: Payer: Self-pay | Admitting: Family Medicine

## 2021-02-17 DIAGNOSIS — E119 Type 2 diabetes mellitus without complications: Secondary | ICD-10-CM

## 2021-02-18 NOTE — Telephone Encounter (Signed)
Appointment scheduled for follow up 

## 2021-03-02 ENCOUNTER — Ambulatory Visit: Payer: Self-pay | Admitting: Surgery

## 2021-03-02 DIAGNOSIS — K641 Second degree hemorrhoids: Secondary | ICD-10-CM | POA: Diagnosis not present

## 2021-03-02 DIAGNOSIS — K6289 Other specified diseases of anus and rectum: Secondary | ICD-10-CM | POA: Diagnosis not present

## 2021-03-02 DIAGNOSIS — Z86010 Personal history of colonic polyps: Secondary | ICD-10-CM | POA: Diagnosis not present

## 2021-03-02 DIAGNOSIS — E119 Type 2 diabetes mellitus without complications: Secondary | ICD-10-CM | POA: Diagnosis not present

## 2021-03-03 ENCOUNTER — Other Ambulatory Visit: Payer: Self-pay | Admitting: Family

## 2021-03-03 ENCOUNTER — Other Ambulatory Visit: Payer: Self-pay

## 2021-03-03 DIAGNOSIS — E119 Type 2 diabetes mellitus without complications: Secondary | ICD-10-CM

## 2021-03-03 MED ORDER — OZEMPIC (1 MG/DOSE) 4 MG/3ML ~~LOC~~ SOPN
PEN_INJECTOR | SUBCUTANEOUS | 2 refills | Status: DC
Start: 2021-03-03 — End: 2021-05-28

## 2021-03-03 NOTE — Progress Notes (Unsigned)
Refilled Ozempic per refill protocol. Pt has an upcoming appointment with PCP in November.

## 2021-04-02 ENCOUNTER — Encounter: Payer: Self-pay | Admitting: Family Medicine

## 2021-04-02 ENCOUNTER — Other Ambulatory Visit: Payer: Self-pay

## 2021-04-02 ENCOUNTER — Ambulatory Visit: Payer: BC Managed Care – PPO | Admitting: Family Medicine

## 2021-04-02 VITALS — BP 140/72 | HR 85 | Temp 97.7°F | Ht 70.0 in | Wt 296.6 lb

## 2021-04-02 DIAGNOSIS — E559 Vitamin D deficiency, unspecified: Secondary | ICD-10-CM | POA: Diagnosis not present

## 2021-04-02 DIAGNOSIS — R030 Elevated blood-pressure reading, without diagnosis of hypertension: Secondary | ICD-10-CM

## 2021-04-02 DIAGNOSIS — E7800 Pure hypercholesterolemia, unspecified: Secondary | ICD-10-CM

## 2021-04-02 DIAGNOSIS — E119 Type 2 diabetes mellitus without complications: Secondary | ICD-10-CM

## 2021-04-02 LAB — COMPREHENSIVE METABOLIC PANEL
ALT: 35 U/L (ref 0–53)
AST: 20 U/L (ref 0–37)
Albumin: 4.5 g/dL (ref 3.5–5.2)
Alkaline Phosphatase: 71 U/L (ref 39–117)
BUN: 16 mg/dL (ref 6–23)
CO2: 26 mEq/L (ref 19–32)
Calcium: 9.5 mg/dL (ref 8.4–10.5)
Chloride: 101 mEq/L (ref 96–112)
Creatinine, Ser: 0.91 mg/dL (ref 0.40–1.50)
GFR: 98.41 mL/min (ref >60.00)
Glucose, Bld: 113 mg/dL — ABNORMAL HIGH (ref 70–99)
Potassium: 4.2 mEq/L (ref 3.5–5.1)
Sodium: 137 mEq/L (ref 135–145)
Total Bilirubin: 0.8 mg/dL (ref 0.2–1.2)
Total Protein: 6.8 g/dL (ref 6.0–8.3)

## 2021-04-02 LAB — URINALYSIS, ROUTINE W REFLEX MICROSCOPIC
Bilirubin Urine: NEGATIVE
Hgb urine dipstick: NEGATIVE
Ketones, ur: NEGATIVE
Leukocytes,Ua: NEGATIVE
Nitrite: NEGATIVE
RBC / HPF: NONE SEEN (ref 0–?)
Specific Gravity, Urine: >=1.03 — AB (ref 1.000–1.030)
Total Protein, Urine: 30 — AB
Urine Glucose: NEGATIVE
Urobilinogen, UA: 0.2 (ref 0.0–1.0)
pH: 6 (ref 5.0–8.0)

## 2021-04-02 LAB — CBC
HCT: 45.4 % (ref 39.0–52.0)
Hemoglobin: 15.4 g/dL (ref 13.0–17.0)
MCHC: 33.9 g/dL (ref 30.0–36.0)
MCV: 84.4 fl (ref 78.0–100.0)
Platelets: 296 10*3/uL (ref 150.0–400.0)
RBC: 5.38 Mil/uL (ref 4.22–5.81)
RDW: 13.5 % (ref 11.5–15.5)
WBC: 11.3 10*3/uL — ABNORMAL HIGH (ref 4.0–10.5)

## 2021-04-02 LAB — MICROALBUMIN / CREATININE URINE RATIO
Creatinine,U: 202.9 mg/dL
Microalb Creat Ratio: 13.6 mg/g (ref 0.0–30.0)
Microalb, Ur: 27.5 mg/dL — ABNORMAL HIGH (ref 0.0–1.9)

## 2021-04-02 LAB — VITAMIN D 25 HYDROXY (VIT D DEFICIENCY, FRACTURES): VITD: 25.19 ng/mL — ABNORMAL LOW (ref 30.00–100.00)

## 2021-04-02 LAB — LDL CHOLESTEROL, DIRECT: Direct LDL: 77 mg/dL

## 2021-04-02 LAB — HEMOGLOBIN A1C: Hgb A1c MFr Bld: 6.1 % (ref 4.6–6.5)

## 2021-04-02 MED ORDER — LISINOPRIL 10 MG PO TABS: 10.0000 mg | ORAL_TABLET | Freq: Every day | ORAL | 3 refills | Status: DC

## 2021-04-02 NOTE — Progress Notes (Signed)
Established Patient Office Visit  Subjective:  Patient ID: Jason Waters, male    DOB: 02/22/1971  Age: 50 y.o. MRN: [id number]  CC:  Chief Complaint  Patient presents with   Follow-up    Routine follow up, no concerns.     HPI Jason Waters presents for follow-up of diabetes, elevated LDL cholesterol and vitamin D deficiency..  Continue simvastatin for cholesterol.  Continues semaglutide with metformin for diabetes and high-dose weekly vitamin D for vitamin D deficiency.  No longer [place].  He is working as a Surveyor, mining for [employer].  Past Medical History:  Diagnosis Date   Diabetes mellitus without complication ([place])    Hyperlipidemia    Hyperplastic rectal polyp    Medical history non-contributory    Neuromuscular disorder ([place])    left arm goes to sleep frequently    Perirectal fistula     Past Surgical History:  Procedure Laterality Date   HAND SURGERY Left    LUMBAR LAMINECTOMY/DECOMPRESSION MICRODISCECTOMY Right 07/09/2015   Procedure: RIGHT SIDED LUMBAR 5-SACRUM 1 MICRODISECTOMY;  Surgeon: Phylliss Bob, MD;  Location: [facility];  Service: Orthopedics;  Laterality: Right;  Right sided lumbar 5-sacrum 1 microdisectomy   POLYPECTOMY  02/2019   HPP rectal    RECTAL EXAM UNDER ANESTHESIA N/A 03/15/2019   Procedure: ANORECTAL EXAM UNDER ANESTHESIA, REPAIR OF PERIRECTAL FISTULA, POSSIBLE HEMORROIDECTOMY;  Surgeon: Michael Boston, MD;  Location: [facility];  Service: General;  Laterality: N/A;   WISDOM TOOTH EXTRACTION      Family History  Problem Relation Age of Onset   Diabetes Father    Heart disease Father    Esophageal cancer Maternal Grandmother    Colon cancer Neg Hx    Colon polyps Neg Hx    Rectal cancer Neg Hx    Stomach cancer Neg Hx     Social History   Socioeconomic History   Marital status: Married    Spouse name: Not on file   Number of children: Not on file   Years of education: Not on file   Highest education level: Not on file  Occupational  History   Not on file  Tobacco Use   Smoking status: Never   Smokeless tobacco: Never  Vaping Use   Vaping Use: Never used  Substance and Sexual Activity   Alcohol use: Yes    Comment: rarely   Drug use: No   Sexual activity: Yes    Birth control/protection: None  Other Topics Concern   Not on file  Social History Narrative   Not on file   Social Determinants of Health   Financial Resource Strain: Not on file  Food Insecurity: Not on file  Transportation Needs: Not on file  Physical Activity: Not on file  Stress: Not on file  Social Connections: Not on file  Intimate Partner Violence: Not on file    Outpatient Medications Prior to Visit  Medication Sig Dispense Refill   metFORMIN (GLUCOPHAGE-XR) 500 MG 24 hr tablet TAKE 1 TABLET BY MOUTH TWICE DAILY 60 tablet 1   Semaglutide, 1 MG/DOSE, (OZEMPIC, 1 MG/DOSE,) 4 MG/3ML SOPN Inject 1mg  into skin once per week. 3 mL 2   simvastatin (ZOCOR) 40 MG tablet TAKE 1 TABLET BY MOUTH EVERY NIGHT AT BEDTIME 90 tablet 3   ergocalciferol (VITAMIN D2) 1.25 MG (50000 UT) capsule Take 1 capsule (50,000 Units total) by mouth once a week. (Patient not taking: Reported on 04/02/2021) 12 capsule 3   Facility-Administered Medications Prior to Visit  Medication Dose Route Frequency Provider Last Rate Last Admin   0.9 %  sodium chloride infusion  500 mL Intravenous Once Armbruster, Carlota Raspberry, MD       0.9 %  sodium chloride infusion  500 mL Intravenous Once Armbruster, Carlota Raspberry, MD        Allergies  Allergen Reactions   Bee Venom Swelling    ROS Review of Systems  Constitutional:  Negative for chills, diaphoresis, fatigue, fever and unexpected weight change.  HENT: Negative.    Eyes:  Negative for photophobia and visual disturbance.  Respiratory: Negative.    Cardiovascular: Negative.   Gastrointestinal: Negative.   Endocrine: Negative for polyphagia and polyuria.  Genitourinary:  Negative for difficulty urinating, frequency and urgency.   Musculoskeletal:  Negative for gait problem and joint swelling.  Skin:  Negative for color change.  Neurological:  Negative for speech difficulty and weakness.     Objective:    Physical Exam Vitals and nursing note reviewed.  Constitutional:      General: He is not in acute distress.    Appearance: Normal appearance. He is obese. He is not ill-appearing, toxic-appearing or diaphoretic.  HENT:     Head: Normocephalic and atraumatic.     Right Ear: External ear normal.     Left Ear: External ear normal.     Mouth/Throat:     Mouth: Mucous membranes are moist.     Pharynx: Oropharynx is clear. No oropharyngeal exudate or posterior oropharyngeal erythema.  Eyes:     General: No scleral icterus.       Right eye: No discharge.        Left eye: No discharge.     Extraocular Movements: Extraocular movements intact.     Conjunctiva/sclera: Conjunctivae normal.     Pupils: Pupils are equal, round, and reactive to light.  Neck:     Vascular: No carotid bruit.  Cardiovascular:     Rate and Rhythm: Normal rate and regular rhythm.     Pulses:          Dorsalis pedis pulses are 2+ on the right side and 2+ on the left side.       Posterior tibial pulses are 2+ on the right side and 2+ on the left side.  Pulmonary:     Effort: Pulmonary effort is normal.     Breath sounds: Normal breath sounds.  Musculoskeletal:     Cervical back: No rigidity or tenderness.     Right lower leg: No edema.     Left lower leg: No edema.  Lymphadenopathy:     Cervical: No cervical adenopathy.  Skin:    General: Skin is warm and dry.  Neurological:     Mental Status: He is alert and oriented to person, place, and time.  Psychiatric:        Mood and Affect: Mood normal.        Behavior: Behavior normal.   Diabetic Foot Exam - Simple   Simple Foot Form Diabetic Foot exam was performed with the following findings: Yes 04/02/2021  2:06 PM  Visual Inspection No deformities, no ulcerations, no other skin  breakdown bilaterally: Yes Sensation Testing Intact to touch and monofilament testing bilaterally: Yes Pulse Check Posterior Tibialis and Dorsalis pulse intact bilaterally: Yes Comments      BP 140/72 (BP Location: Left Arm, Patient Position: Sitting, Cuff Size: Large)   Pulse 85   Temp 97.7 F (36.5 C) (Temporal)   Ht 5\' 10"  (1.778 m)  Wt 296 lb 9.6 oz (134.5 kg)   SpO2 96%   BMI 42.56 kg/m  Wt Readings from Last 3 Encounters:  04/02/21 296 lb 9.6 oz (134.5 kg)  01/04/20 (!) 305 lb (138.3 kg)  09/10/19 (!) 305 lb (138.3 kg)     Health Maintenance Due  Topic Date Due   Pneumococcal Vaccine 19-22 Years old (1 - PCV) Never done   Hepatitis C Screening  Never done   TETANUS/TDAP  Never done   URINE MICROALBUMIN  07/01/2020   HEMOGLOBIN A1C  07/06/2020   OPHTHALMOLOGY EXAM  11/19/2020   Zoster Vaccines- Shingrix (1 of 2) Never done    There are no preventive care reminders to display for this patient.  Lab Results  Component Value Date   TSH 2.84 03/22/2018   Lab Results  Component Value Date   WBC 11.2 (H) 01/04/2020   HGB 15.5 01/04/2020   HCT 45.3 01/04/2020   MCV 84.8 01/04/2020   PLT 284 01/04/2020   Lab Results  Component Value Date   NA 138 01/04/2020   K 4.1 01/04/2020   CO2 25 01/04/2020   GLUCOSE 121 (H) 01/04/2020   BUN 16 01/04/2020   CREATININE 0.95 01/04/2020   BILITOT 0.6 01/04/2020   ALKPHOS 81 07/02/2019   AST 31 01/04/2020   ALT 55 (H) 01/04/2020   PROT 6.7 01/04/2020   ALBUMIN 4.3 07/02/2019   CALCIUM 9.8 01/04/2020   ANIONGAP 9 03/07/2019   GFR 91.02 07/02/2019   Lab Results  Component Value Date   CHOL 165 07/02/2019   Lab Results  Component Value Date   HDL 40.10 07/02/2019   Lab Results  Component Value Date   LDLCALC 85 03/22/2018   Lab Results  Component Value Date   TRIG 252.0 (H) 07/02/2019   Lab Results  Component Value Date   CHOLHDL 4 07/02/2019   Lab Results  Component Value Date   HGBA1C 8.1 (H)  01/04/2020      Assessment & Plan:   Problem List Items Addressed This Visit       Endocrine   Controlled type 2 diabetes mellitus without complication, without long-term current use of insulin (HCC) - Primary   Relevant Medications   lisinopril (ZESTRIL) 10 MG tablet   Other Relevant Orders   CBC   Comprehensive metabolic panel   Hemoglobin A1c   Urinalysis, Routine w reflex microscopic   Microalbumin / creatinine urine ratio     Other   Elevated LDL cholesterol level   Relevant Orders   Comprehensive metabolic panel   LDL cholesterol, direct   Vitamin D deficiency   Relevant Orders   VITAMIN D 25 Hydroxy (Vit-D Deficiency, Fractures)   Elevated BP without diagnosis of hypertension   Relevant Medications   lisinopril (ZESTRIL) 10 MG tablet   Other Relevant Orders   Comprehensive metabolic panel   Urinalysis, Routine w reflex microscopic   Microalbumin / creatinine urine ratio    Meds ordered this encounter  Medications   lisinopril (ZESTRIL) 10 MG tablet    Sig: Take 1 tablet (10 mg total) by mouth daily.    Dispense:  90 tablet    Refill:  3    Follow-up: Return in about 6 months (around 09/30/2021), or Return fasting for full physical..  Information was given blood pressure and lisinopril.  Explained that we are starting the medication to help protect his kidneys.  He will report any unusual cough.  Follow-up next visit fasting for complete physical.  [name], MD

## 2021-04-30 ENCOUNTER — Telehealth: Payer: Self-pay | Admitting: Family Medicine

## 2021-04-30 DIAGNOSIS — E119 Type 2 diabetes mellitus without complications: Secondary | ICD-10-CM

## 2021-05-04 NOTE — Telephone Encounter (Signed)
Please advise message below  °

## 2021-05-11 MED ORDER — OLMESARTAN MEDOXOMIL 20 MG PO TABS: 10.0000 mg | ORAL_TABLET | Freq: Every day | ORAL | 0 refills | Status: DC

## 2021-05-12 NOTE — Telephone Encounter (Signed)
Pt called and knows about the med change.

## 2021-05-12 NOTE — Telephone Encounter (Signed)
Called patient to inform of change in Rx no answer, unable to leave a message will call back. Also sent message via Mychart asked patient to give [place] a call to let us know that he have received the message.

## 2021-05-28 ENCOUNTER — Other Ambulatory Visit: Payer: Self-pay | Admitting: Family Medicine

## 2021-05-28 DIAGNOSIS — E119 Type 2 diabetes mellitus without complications: Secondary | ICD-10-CM

## 2021-05-28 MED ORDER — OZEMPIC (1 MG/DOSE) 4 MG/3ML ~~LOC~~ SOPN: PEN_INJECTOR | SUBCUTANEOUS | 2 refills | Status: DC

## 2021-05-28 NOTE — Telephone Encounter (Signed)
[facility]ill canceled at [facility] and sent to requested pharmacy.

## 2021-05-28 NOTE — Addendum Note (Signed)
Addended by: Lynda Rainwater on: 05/28/2021 03:22 PM   Modules accepted: Orders

## 2021-05-28 NOTE — Telephone Encounter (Signed)
Pt called regarding this refill on Ozempic... it runs out today.

## 2021-05-28 NOTE — Telephone Encounter (Signed)
My last msg was attached to the wrong thing. Pt called today asking for his Ozempic to be refilled.

## 2021-05-28 NOTE — Telephone Encounter (Signed)
Refill was sent to wrong pharm and it's the wrong amount... pt called and said it's supposed to go to [facility] on [address] (per his insurance) and it has to be 90 days for insurance to cover.

## 2021-05-28 NOTE — Telephone Encounter (Signed)
Patient called for refill on pending Rx [facility] patient wanted Rx sent to [facility] instead also wanting 90 day supply, okay to send in 90 day supply? Please advise last refill 02/26/2021 last OV 04/02/2021 please advise.

## 2021-05-28 NOTE — Telephone Encounter (Signed)
Refill sent in patient aware.

## 2021-06-06 ENCOUNTER — Other Ambulatory Visit: Payer: Self-pay | Admitting: Family Medicine

## 2021-06-06 DIAGNOSIS — E119 Type 2 diabetes mellitus without complications: Secondary | ICD-10-CM

## 2021-07-27 ENCOUNTER — Emergency Department (HOSPITAL_BASED_OUTPATIENT_CLINIC_OR_DEPARTMENT_OTHER): Payer: BC Managed Care – PPO

## 2021-07-27 ENCOUNTER — Emergency Department (HOSPITAL_BASED_OUTPATIENT_CLINIC_OR_DEPARTMENT_OTHER)
Admission: EM | Admit: 2021-07-27 | Discharge: 2021-07-27 | Disposition: A | Discharge status: Home / Self Care | Payer: BC Managed Care – PPO | Attending: Emergency Medicine | Admitting: Emergency Medicine

## 2021-07-27 ENCOUNTER — Encounter (HOSPITAL_BASED_OUTPATIENT_CLINIC_OR_DEPARTMENT_OTHER): Payer: Self-pay | Admitting: *Deleted

## 2021-07-27 ENCOUNTER — Other Ambulatory Visit: Payer: Self-pay

## 2021-07-27 DIAGNOSIS — I10 Essential (primary) hypertension: Secondary | ICD-10-CM | POA: Diagnosis not present

## 2021-07-27 DIAGNOSIS — Z79899 Other long term (current) drug therapy: Secondary | ICD-10-CM | POA: Insufficient documentation

## 2021-07-27 DIAGNOSIS — Z7984 Long term (current) use of oral hypoglycemic drugs: Secondary | ICD-10-CM | POA: Diagnosis not present

## 2021-07-27 DIAGNOSIS — R079 Chest pain, unspecified: Secondary | ICD-10-CM | POA: Diagnosis not present

## 2021-07-27 DIAGNOSIS — E119 Type 2 diabetes mellitus without complications: Secondary | ICD-10-CM | POA: Insufficient documentation

## 2021-07-27 DIAGNOSIS — R0789 Other chest pain: Secondary | ICD-10-CM | POA: Insufficient documentation

## 2021-07-27 DIAGNOSIS — Z7982 Long term (current) use of aspirin: Secondary | ICD-10-CM | POA: Diagnosis not present

## 2021-07-27 LAB — BASIC METABOLIC PANEL
Anion gap: 8 (ref 5–15)
BUN: 18 mg/dL (ref 6–20)
CO2: 28 mmol/L (ref 22–32)
Calcium: 9.4 mg/dL (ref 8.9–10.3)
Chloride: 103 mmol/L (ref 98–111)
Creatinine, Ser: 1.13 mg/dL (ref 0.61–1.24)
GFR, Estimated: >60 mL/min (ref >60)
Glucose, Bld: 149 mg/dL — ABNORMAL HIGH (ref 70–99)
Potassium: 3.8 mmol/L (ref 3.5–5.1)
Sodium: 139 mmol/L (ref 135–145)

## 2021-07-27 LAB — CBC
HCT: 43.1 % (ref 39.0–52.0)
Hemoglobin: 14.8 g/dL (ref 13.0–17.0)
MCH: 28.8 pg (ref 26.0–34.0)
MCHC: 34.3 g/dL (ref 30.0–36.0)
MCV: 84 fL (ref 80.0–100.0)
Platelets: 287 10*3/uL (ref 150–400)
RBC: 5.13 MIL/uL (ref 4.22–5.81)
RDW: 12.9 % (ref 11.5–15.5)
WBC: 9.5 10*3/uL (ref 4.0–10.5)
nRBC: 0 % (ref 0.0–0.2)

## 2021-07-27 LAB — TROPONIN I (HIGH SENSITIVITY)
Troponin I (High Sensitivity): 3 ng/L (ref <18)
Troponin I (High Sensitivity): 3 ng/L (ref <18)

## 2021-07-27 MED ORDER — LIDOCAINE VISCOUS HCL 2 % MT SOLN
15.0000 mL | Freq: Once | OROMUCOSAL | Status: AC
  Administered 2021-07-27: 15 mL via ORAL
  Filled 2021-07-27: qty 15

## 2021-07-27 MED ORDER — OMEPRAZOLE 20 MG PO CPDR: 20.0000 mg | DELAYED_RELEASE_CAPSULE | Freq: Every day | ORAL | 0 refills | Status: DC

## 2021-07-27 MED ORDER — ALUM & MAG HYDROXIDE-SIMETH 200-200-20 MG/5ML PO SUSP
30.0000 mL | Freq: Once | ORAL | Status: AC
  Administered 2021-07-27: 30 mL via ORAL
  Filled 2021-07-27: qty 30

## 2021-07-27 MED ORDER — ASPIRIN EC 325 MG PO TBEC
325.0000 mg | DELAYED_RELEASE_TABLET | Freq: Once | ORAL | Status: AC
  Administered 2021-07-27: 325 mg via ORAL
  Filled 2021-07-27: qty 1

## 2021-07-27 MED ORDER — NITROGLYCERIN 2 % TD OINT
1.0000 [in_us] | TOPICAL_OINTMENT | Freq: Once | TRANSDERMAL | Status: AC
  Administered 2021-07-27: 1 [in_us] via TOPICAL
  Filled 2021-07-27: qty 1

## 2021-07-27 NOTE — ED Triage Notes (Signed)
Pt states that around 1930 last night he had onset of a pain in his chest "sharp, burning" just to the left of the sternum area. He took some antacids without relief.  ?

## 2021-07-27 NOTE — Discharge Instructions (Signed)
You were seen today for chest pain.  Your work-up was reassuring including your cardiac test.  Given your risk factors, you need to follow-up with cardiology as an outpatient.  Start on omeprazole to address any potential reflux component of your pain.  If you have any new or worsening symptoms, you should be reevaluated. ?

## 2021-07-27 NOTE — ED Provider Notes (Signed)
[facility] EMERGENCY DEPT Provider Note   CSN: [id number] Arrival date & time: 07/27/21  0008     History  Chief Complaint  Patient presents with   Chest Pain    Jason Waters is a 51 y.o. male.  HPI     This is a 51 year old male with a history of diabetes, hypertension, hyperlipidemia who presents with chest discomfort.  Onset of symptoms around 7:30 PM.  He states that it is burning in nature.  It started before he ate dinner.  Eating did not worsen or alleviate the pain.  He took some Pepto-Bismol with no significant relief.  Denies exertional symptoms.  Patient denies any history of coronary artery disease but states he got scared when the symptoms did not improve.  Denies sweating or shortness of breath.  Home Medications Prior to Admission medications   Medication Sig Start Date End Date Taking? Authorizing Provider  omeprazole (PRILOSEC) 20 MG capsule Take 1 capsule (20 mg total) by mouth daily. 07/27/21  Yes Tykia Mellone, Barbette Hair, MD  ergocalciferol (VITAMIN D2) 1.25 MG (50000 UT) capsule Take 1 capsule (50,000 Units total) by mouth once a week. Patient not taking: Reported on 04/02/2021 06/25/20   Dutch Quint B, FNP  metFORMIN (GLUCOPHAGE-XR) 500 MG 24 hr tablet TAKE 1 TABLET BY MOUTH TWICE DAILY 02/17/21   [name], MD  olmesartan (BENICAR) 20 MG tablet Take 0.5 tablets (10 mg total) by mouth daily. 05/11/21   [name], MD  Semaglutide, 1 MG/DOSE, (OZEMPIC, 1 MG/DOSE,) 4 MG/3ML SOPN INJECT 1 MG INTO THE SKIN ONCE PER WEEK 06/07/21   [name], MD  simvastatin (ZOCOR) 40 MG tablet TAKE 1 TABLET BY MOUTH EVERY NIGHT AT BEDTIME 02/13/21   [name], MD      Allergies    Bee venom    Review of Systems   Review of Systems  Constitutional:  Negative for fever.  Respiratory:  Negative for shortness of breath.   Cardiovascular:  Positive for chest pain.  Gastrointestinal:  Negative for abdominal pain,  nausea and vomiting.  All other systems reviewed and are negative.  Physical Exam Updated Vital Signs BP 133/87    Pulse 72    Temp 98.1 F (36.7 C) (Oral)    Resp 17    Ht 1.778 m ('5\' 10"'$ )    Wt 127 kg    SpO2 99%    BMI 40.18 kg/m  Physical Exam Vitals and nursing note reviewed.  Constitutional:      Appearance: He is well-developed. He is obese. He is not ill-appearing.  HENT:     Head: Normocephalic and atraumatic.  Eyes:     Pupils: Pupils are equal, round, and reactive to light.  Cardiovascular:     Rate and Rhythm: Normal rate and regular rhythm.     Heart sounds: Normal heart sounds. No murmur heard. Pulmonary:     Effort: Pulmonary effort is normal. No respiratory distress.     Breath sounds: Normal breath sounds. No wheezing.  Abdominal:     General: Bowel sounds are normal.     Palpations: Abdomen is soft.     Tenderness: There is no abdominal tenderness. There is no rebound.  Musculoskeletal:     Cervical back: Neck supple.     Right lower leg: No edema.     Left lower leg: No edema.  Lymphadenopathy:     Cervical: No cervical adenopathy.  Skin:    General: Skin is warm  and dry.  Neurological:     Mental Status: He is alert and oriented to person, place, and time.  Psychiatric:        Mood and Affect: Mood normal.    ED Results / Procedures / Treatments   Labs (all labs ordered are listed, but only abnormal results are displayed) Labs Reviewed  BASIC METABOLIC PANEL - Abnormal; Notable for the following components:      Result Value   Glucose, Bld 149 (*)    All other components within normal limits  CBC  TROPONIN I (HIGH SENSITIVITY)  TROPONIN I (HIGH SENSITIVITY)    EKG EKG Interpretation  Date/Time:  Monday July 27 2021 00:15:36 EDT Ventricular Rate:  72 PR Interval:  176 QRS Duration: 79 QT Interval:  356 QTC Calculation: 390 R Axis:   70 Text Interpretation: Sinus rhythm ST elev, probable normal early repol pattern no stemi Baseline  wander Confirmed by Thayer Jew ([id number]) on 07/27/2021 1:24:55 AM  Radiology DG Chest Port 1 View  Result Date: 07/27/2021 CLINICAL DATA:  Chest pain. EXAM: PORTABLE CHEST 1 VIEW COMPARISON:  Chest radiograph dated 07/03/2015. FINDINGS: The heart size and mediastinal contours are within normal limits. Both lungs are clear. The visualized skeletal structures are unremarkable. IMPRESSION: No active disease. Electronically Signed   By: Anner Crete M.D.   On: 07/27/2021 00:37    Procedures Procedures    Medications Ordered in ED Medications  aspirin EC tablet 325 mg (325 mg Oral Given 07/27/21 0031)  nitroGLYCERIN (NITROGLYN) 2 % ointment 1 inch (1 inch Topical Given 07/27/21 0031)  alum & mag hydroxide-simeth (MAALOX/MYLANTA) 200-200-20 MG/5ML suspension 30 mL (30 mLs Oral Given 07/27/21 0208)    And  lidocaine (XYLOCAINE) 2 % viscous mouth solution 15 mL (15 mLs Oral Given 07/27/21 0208)    ED Course/ Medical Decision Making/ A&P                           Medical Decision Making Amount and/or Complexity of Data Reviewed Labs: ordered. Radiology: ordered.  Risk OTC drugs. Prescription drug management.   This patient presents to the ED for concern of chest pain, this involves an extensive number of treatment options, and is a complaint that carries with it a high risk of complications and morbidity.  The differential diagnosis includes reflux, ACS although atypical, less likely pneumonia or pneumothorax  MDM:    This is a 51 year old male who presents with chest discomfort.  It is burning in nature.  He is fairly well-appearing and nontoxic.  Vital signs are reassuring.  Pain is atypical for ACS although he does have significant risk factors.  EKG shows no evidence of acute ischemia or arrhythmia.  Troponin x2 negative.  Feel this is highly reassuring given his ongoing nature of pain.  Patient was given a GI cocktail.  Given the burning nature of pain, will start on omeprazole to  cover for any GI etiology.  In the meantime, recommend that he follow-up with cardiology as an outpatient for definitive stress testing.  Patient was reassured. (Labs, imaging)  Labs: I Ordered, and personally interpreted labs.  The pertinent results include: CBC, BMP, troponin x2 negative  Imaging Studies ordered: I ordered imaging studies including chest x-ray negative for pneumothorax or pneumonia I independently visualized and interpreted imaging. I agree with the radiologist interpretation  Additional history obtained from chart review.  External records from outside source obtained and reviewed including prior visit  Critical Interventions: GI cocktail  Consultations: I requested consultation with the NA,  and discussed lab and imaging findings as well as pertinent plan - they recommend: N/A  Cardiac Monitoring: The patient was maintained on a cardiac monitor.  I personally viewed and interpreted the cardiac monitored which showed an underlying rhythm of: Normal sinus   Reevaluation: After the interventions noted above, I reevaluated the patient and found that they have :improved   Considered admission for: Chest pain rule out  Social Determinants of Health: Lives independently  Disposition: Discharge  Co morbidities that complicate the patient evaluation  Past Medical History:  Diagnosis Date   Diabetes mellitus without complication ([place])    Hyperlipidemia    Hyperplastic rectal polyp    Medical history non-contributory    Neuromuscular disorder ([place])    left arm goes to sleep frequently    Perirectal fistula      Medicines Meds ordered this encounter  Medications   aspirin EC tablet 325 mg   nitroGLYCERIN (NITROGLYN) 2 % ointment 1 inch   AND Linked Order Group    alum & mag hydroxide-simeth (MAALOX/MYLANTA) 200-200-20 MG/5ML suspension 30 mL    lidocaine (XYLOCAINE) 2 % viscous mouth solution 15 mL   omeprazole (PRILOSEC) 20 MG capsule    Sig: Take 1  capsule (20 mg total) by mouth daily.    Dispense:  30 capsule    Refill:  0    I have reviewed the patients home medicines and have made adjustments as needed  Problem List / ED Course: Problem List Items Addressed This Visit   None Visit Diagnoses     Atypical chest pain    -  Primary   Chest pain       Relevant Orders   DG Chest Port 1 View (Completed)                   Final Clinical Impression(s) / ED Diagnoses Final diagnoses:  Atypical chest pain    Rx / DC Orders ED Discharge Orders          Ordered    omeprazole (PRILOSEC) 20 MG capsule  Daily        07/27/21 0259              Merryl Hacker, MD 07/27/21 0302

## 2021-08-03 ENCOUNTER — Other Ambulatory Visit: Payer: Self-pay | Admitting: Family Medicine

## 2021-08-03 DIAGNOSIS — E119 Type 2 diabetes mellitus without complications: Secondary | ICD-10-CM

## 2021-08-10 ENCOUNTER — Other Ambulatory Visit: Payer: Self-pay | Admitting: Family

## 2021-08-10 DIAGNOSIS — E119 Type 2 diabetes mellitus without complications: Secondary | ICD-10-CM

## 2021-09-30 ENCOUNTER — Ambulatory Visit (INDEPENDENT_AMBULATORY_CARE_PROVIDER_SITE_OTHER): Payer: BC Managed Care – PPO | Admitting: Family Medicine

## 2021-09-30 ENCOUNTER — Encounter: Payer: Self-pay | Admitting: Family Medicine

## 2021-09-30 VITALS — BP 124/68 | HR 78 | Temp 97.4°F | Ht 70.0 in | Wt 286.0 lb

## 2021-09-30 DIAGNOSIS — E7800 Pure hypercholesterolemia, unspecified: Secondary | ICD-10-CM

## 2021-09-30 DIAGNOSIS — E559 Vitamin D deficiency, unspecified: Secondary | ICD-10-CM

## 2021-09-30 DIAGNOSIS — E119 Type 2 diabetes mellitus without complications: Secondary | ICD-10-CM

## 2021-09-30 DIAGNOSIS — Z0000 Encounter for general adult medical examination without abnormal findings: Secondary | ICD-10-CM

## 2021-09-30 DIAGNOSIS — M545 Low back pain, unspecified: Secondary | ICD-10-CM

## 2021-09-30 LAB — PSA: PSA: 1.85 ng/mL (ref 0.10–4.00)

## 2021-09-30 LAB — URINALYSIS, ROUTINE W REFLEX MICROSCOPIC
Bilirubin Urine: NEGATIVE
Hgb urine dipstick: NEGATIVE
Ketones, ur: NEGATIVE
Leukocytes,Ua: NEGATIVE
Nitrite: NEGATIVE
RBC / HPF: NONE SEEN (ref 0–?)
Specific Gravity, Urine: >=1.03 — AB (ref 1.000–1.030)
Total Protein, Urine: NEGATIVE
Urine Glucose: NEGATIVE
Urobilinogen, UA: 0.2 (ref 0.0–1.0)
pH: 6 (ref 5.0–8.0)

## 2021-09-30 LAB — COMPREHENSIVE METABOLIC PANEL
ALT: 24 U/L (ref 0–53)
AST: 20 U/L (ref 0–37)
Albumin: 4.5 g/dL (ref 3.5–5.2)
Alkaline Phosphatase: 64 U/L (ref 39–117)
BUN: 20 mg/dL (ref 6–23)
CO2: 28 mEq/L (ref 19–32)
Calcium: 9.7 mg/dL (ref 8.4–10.5)
Chloride: 104 mEq/L (ref 96–112)
Creatinine, Ser: 0.87 mg/dL (ref 0.40–1.50)
GFR: 100.4 mL/min (ref >60.00)
Glucose, Bld: 100 mg/dL — ABNORMAL HIGH (ref 70–99)
Potassium: 4.3 mEq/L (ref 3.5–5.1)
Sodium: 139 mEq/L (ref 135–145)
Total Bilirubin: 0.8 mg/dL (ref 0.2–1.2)
Total Protein: 7.1 g/dL (ref 6.0–8.3)

## 2021-09-30 LAB — CBC
HCT: 43.4 % (ref 39.0–52.0)
Hemoglobin: 14.5 g/dL (ref 13.0–17.0)
MCHC: 33.5 g/dL (ref 30.0–36.0)
MCV: 86.2 fl (ref 78.0–100.0)
Platelets: 281 10*3/uL (ref 150.0–400.0)
RBC: 5.03 Mil/uL (ref 4.22–5.81)
RDW: 13.3 % (ref 11.5–15.5)
WBC: 10.5 10*3/uL (ref 4.0–10.5)

## 2021-09-30 LAB — LIPID PANEL
Cholesterol: 120 mg/dL (ref 0–200)
HDL: 40.4 mg/dL (ref >39.00)
LDL Cholesterol: 54 mg/dL (ref 0–99)
NonHDL: 79.63
Total CHOL/HDL Ratio: 3
Triglycerides: 128 mg/dL (ref 0.0–149.0)
VLDL: 25.6 mg/dL (ref 0.0–40.0)

## 2021-09-30 LAB — HEMOGLOBIN A1C: Hgb A1c MFr Bld: 5.4 % (ref 4.6–6.5)

## 2021-09-30 LAB — VITAMIN D 25 HYDROXY (VIT D DEFICIENCY, FRACTURES): VITD: 34.98 ng/mL (ref 30.00–100.00)

## 2021-09-30 MED ORDER — ERGOCALCIFEROL 1.25 MG (50000 UT) PO CAPS: 50000.0000 [IU] | ORAL_CAPSULE | ORAL | 3 refills | Status: DC

## 2021-09-30 NOTE — Progress Notes (Signed)
? ?Established Patient Office Visit ? ?Subjective   ?Patient ID: Jason Waters, male    DOB: 12-29-1970  Age: 51 y.o. MRN: [id number] ? ?Chief Complaint  ?Patient presents with  ? Annual Exam  ?  CPE, States that he has some lower back pains that come and go. Patient fasting.   ? ? ?HPI for annual physical and follow-up of hypertension, diabetes, elevated cholesterol.  Blood pressure has been controlled with Benicar 20.  Continues semaglutide and metformin for diabetes.  Has been able to lose weight with it.  Is in a new job and working many hours.  Has found it difficult to make appointments and exercise.  There is a lot of sitting and he has developed some bilateral lower back pain that is nonradiating.  There is no weakness numbness tingling.  Denies change in bowel or bladder function.  Stooling normally.  Urine flow is excellent.  Seen by general surgery for rectal mass with recent exam.  Has not been taking vitamin D daily because his prescription ran out. ? ? ? ?Review of Systems  ?Constitutional: Negative.   ?HENT: Negative.    ?Eyes:  Negative for blurred vision, discharge and redness.  ?Respiratory: Negative.    ?Cardiovascular: Negative.   ?Gastrointestinal:  Negative for abdominal pain and constipation.  ?Genitourinary: Negative.  Negative for frequency, hematuria and urgency.  ?Musculoskeletal:  Positive for back pain. Negative for myalgias.  ?Skin:  Negative for rash.  ?Neurological:  Negative for tingling, loss of consciousness and weakness.  ?Endo/Heme/Allergies:  Negative for polydipsia.  ? ?  ?Objective:  ?  ? ?BP 124/68 (BP Location: Right Arm, Patient Position: Sitting, Cuff Size: Large)   Pulse 78   Temp (!) 97.4 ?F (36.3 ?C) (Temporal)   Ht '5\' 10"'$  (1.778 m)   Wt 286 lb (129.7 kg)   SpO2 97%   BMI 41.04 kg/m?  ?BP Readings from Last 3 Encounters:  ?09/30/21 124/68  ?07/27/21 (!) 146/90  ?04/02/21 140/72  ? ?Wt Readings from Last 3 Encounters:  ?09/30/21 286 lb (129.7 kg)  ?07/27/21 280  lb (127 kg)  ?04/02/21 296 lb 9.6 oz (134.5 kg)  ? ?  ? ?Physical Exam ?Constitutional:   ?   General: He is not in acute distress. ?   Appearance: Normal appearance. He is not ill-appearing, toxic-appearing or diaphoretic.  ?HENT:  ?   Head: Normocephalic and atraumatic.  ?   Right Ear: External ear normal.  ?   Left Ear: External ear normal.  ?   Mouth/Throat:  ?   Mouth: Mucous membranes are moist.  ?   Pharynx: Oropharynx is clear. No oropharyngeal exudate or posterior oropharyngeal erythema.  ?Eyes:  ?   General: No scleral icterus.    ?   Right eye: No discharge.     ?   Left eye: No discharge.  ?   Extraocular Movements: Extraocular movements intact.  ?   Conjunctiva/sclera: Conjunctivae normal.  ?   Pupils: Pupils are equal, round, and reactive to light.  ?Cardiovascular:  ?   Rate and Rhythm: Normal rate and regular rhythm.  ?Pulmonary:  ?   Effort: Pulmonary effort is normal. No respiratory distress.  ?   Breath sounds: Normal breath sounds.  ?Abdominal:  ?   General: Bowel sounds are normal.  ?   Tenderness: There is no abdominal tenderness. There is no guarding.  ?Musculoskeletal:  ?   Cervical back: No rigidity or tenderness.  ?   Lumbar back:  No tenderness or bony tenderness. Normal range of motion. Negative right straight leg raise test and negative left straight leg raise test.  ?Skin: ?   General: Skin is warm and dry.  ?Neurological:  ?   Mental Status: He is alert and oriented to person, place, and time.  ?Psychiatric:     ?   Mood and Affect: Mood normal.     ?   Behavior: Behavior normal.  ? ? ? ?No results found for any visits on 09/30/21. ? ? ? ?The 10-year ASCVD risk score (Arnett DK, et al., 2019) is: 6% ? ?  ?Assessment & Plan:  ? ?Problem List Items Addressed This Visit   ? ?  ? Endocrine  ? Controlled type 2 diabetes mellitus without complication, without long-term current use of insulin ([place])  ? Relevant Orders  ? CBC  ? Comprehensive metabolic panel  ? Hemoglobin A1c  ? Urinalysis,  Routine w reflex microscopic  ?  ? Other  ? Elevated LDL cholesterol level  ? Relevant Orders  ? Comprehensive metabolic panel  ? Lipid panel  ? Vitamin D deficiency  ? Relevant Medications  ? ergocalciferol (VITAMIN D2) 1.25 MG (50000 UT) capsule  ? Other Relevant Orders  ? VITAMIN D 25 Hydroxy (Vit-D Deficiency, Fractures)  ? ?Other Visit Diagnoses   ? ? Healthcare maintenance    -  Primary  ? Relevant Orders  ? PSA  ? Bilateral low back pain without sciatica, unspecified chronicity      ? ?  ? ? ?Return in about 6 months (around 04/02/2022).  ?Information given on health maintenance and disease.  Continue weight loss efforts.  Try to exercise more.  Back exercises were given.  Encouraged continued daily use.  He will schedule his eye appointment as he is scheduled softens up.  All medicines as above ? ?[name], MD ? ?

## 2021-10-15 ENCOUNTER — Telehealth: Payer: Self-pay | Admitting: Family Medicine

## 2021-10-15 DIAGNOSIS — E7800 Pure hypercholesterolemia, unspecified: Secondary | ICD-10-CM

## 2021-10-15 DIAGNOSIS — E559 Vitamin D deficiency, unspecified: Secondary | ICD-10-CM

## 2021-10-15 DIAGNOSIS — E119 Type 2 diabetes mellitus without complications: Secondary | ICD-10-CM

## 2021-10-15 MED ORDER — SIMVASTATIN 40 MG PO TABS: 40.0000 mg | ORAL_TABLET | Freq: Every day | ORAL | 3 refills | Status: DC

## 2021-10-15 MED ORDER — ERGOCALCIFEROL 1.25 MG (50000 UT) PO CAPS: 50000.0000 [IU] | ORAL_CAPSULE | ORAL | 3 refills | Status: DC

## 2021-10-15 MED ORDER — OZEMPIC (1 MG/DOSE) 4 MG/3ML ~~LOC~~ SOPN: PEN_INJECTOR | SUBCUTANEOUS | 2 refills | Status: DC

## 2021-10-15 MED ORDER — METFORMIN HCL ER 500 MG PO TB24: 500.0000 mg | ORAL_TABLET | Freq: Two times a day (BID) | ORAL | 0 refills | Status: DC

## 2021-10-15 NOTE — Telephone Encounter (Signed)
Refills sent in to requested pharmacy

## 2021-10-15 NOTE — Telephone Encounter (Signed)
Pt called in stating meds need sent to new pharmacy. He has new insurance effective today 10/15/21 through [facility]. Advised patient we may need info if prior auth required on any of his meds. Pt to upload new [employer] to chart tonight. He works until 7:00pm.  Preferred Pharmacy eff 10/15/2021: [id number] [place] - [address] Phone:  [phone] Fax:  [phone]    Requesting new RX be sent for the following meds. He was advised those with refills still left to take bottle to new pharmacy but he states he needs new RXs sent in.  ergocalciferol (VITAMIN D2) 1.25 MG metFORMIN (GLUCOPHAGE-XR) 500 MG 24 hr tablet Semaglutide, 1 MG/DOSE, (OZEMPIC, 1 MG/DOSE,) 4 MG/3ML SOPN - OUT OF MED & DUE ON Sunday  simvastatin (ZOCOR) 40 MG tablet

## 2021-12-01 ENCOUNTER — Encounter: Payer: Self-pay | Admitting: Family Medicine

## 2021-12-01 ENCOUNTER — Ambulatory Visit: Payer: 59 | Admitting: Family Medicine

## 2021-12-01 VITALS — BP 130/80 | HR 67 | Temp 96.5°F | Ht 70.0 in | Wt 290.2 lb

## 2021-12-01 DIAGNOSIS — E119 Type 2 diabetes mellitus without complications: Secondary | ICD-10-CM | POA: Diagnosis not present

## 2021-12-01 DIAGNOSIS — R221 Localized swelling, mass and lump, neck: Secondary | ICD-10-CM

## 2021-12-01 LAB — CBC WITH DIFFERENTIAL/PLATELET
Basophils Absolute: 0 10*3/uL (ref 0.0–0.1)
Basophils Relative: 0.5 % (ref 0.0–3.0)
Eosinophils Absolute: 0.1 10*3/uL (ref 0.0–0.7)
Eosinophils Relative: 0.7 % (ref 0.0–5.0)
HCT: 41.5 % (ref 39.0–52.0)
Hemoglobin: 14.3 g/dL (ref 13.0–17.0)
Lymphocytes Relative: 21.1 % (ref 12.0–46.0)
Lymphs Abs: 1.7 10*3/uL (ref 0.7–4.0)
MCHC: 34.6 g/dL (ref 30.0–36.0)
MCV: 84.3 fl (ref 78.0–100.0)
Monocytes Absolute: 0.4 10*3/uL (ref 0.1–1.0)
Monocytes Relative: 5.3 % (ref 3.0–12.0)
Neutro Abs: 6 10*3/uL (ref 1.4–7.7)
Neutrophils Relative %: 72.4 % (ref 43.0–77.0)
Platelets: 249 10*3/uL (ref 150.0–400.0)
RBC: 4.92 Mil/uL (ref 4.22–5.81)
RDW: 13.1 % (ref 11.5–15.5)
WBC: 8.3 10*3/uL (ref 4.0–10.5)

## 2021-12-01 LAB — BASIC METABOLIC PANEL
BUN: 19 mg/dL (ref 6–23)
CO2: 26 mEq/L (ref 19–32)
Calcium: 9.1 mg/dL (ref 8.4–10.5)
Chloride: 104 mEq/L (ref 96–112)
Creatinine, Ser: 0.94 mg/dL (ref 0.40–1.50)
GFR: 94.21 mL/min (ref >60.00)
Glucose, Bld: 102 mg/dL — ABNORMAL HIGH (ref 70–99)
Potassium: 4.2 mEq/L (ref 3.5–5.1)
Sodium: 138 mEq/L (ref 135–145)

## 2021-12-01 NOTE — Progress Notes (Signed)
Established Patient Office Visit  Subjective   Patient ID: Jason Waters, male    DOB: 1970/09/04  Age: 51 y.o. MRN: [id number]  Chief Complaint  Patient presents with   Acute Visit    C/o lump on right side of throat x 3 weeks  No other concerns     HPI evaluation of a 2 to 3-week history of a slightly tender mass on the right side of his anterior neck.  Denies fevers headaches nasal congestion postnasal drip or sore throat.  There is been no cough or weight loss.  No cat scratch.  Continue semaglutide for diabetes.  Unfortunately weight is increased.  His new job requires him to be at work very early in the morning and works till the early evening.  He has been eating his largest meal when he comes home at night.  His job is active but there is little time for exercise.    Review of Systems  Constitutional:  Negative for chills, fever, malaise/fatigue and weight loss.  HENT: Negative.    Eyes:  Negative for blurred vision, discharge and redness.  Respiratory: Negative.    Cardiovascular: Negative.   Gastrointestinal:  Negative for abdominal pain.  Genitourinary: Negative.   Musculoskeletal: Negative.  Negative for myalgias.  Skin:  Negative for rash.  Neurological:  Negative for tingling, loss of consciousness, weakness and headaches.  Endo/Heme/Allergies:  Negative for polydipsia.      Objective:     BP 130/80 (BP Location: Right Arm, Patient Position: Sitting, Cuff Size: Normal)   Pulse 67   Temp (!) 96.5 F (35.8 C) (Temporal)   Ht '5\' 10"'$  (1.778 m)   Wt 290 lb 3.2 oz (131.6 kg)   SpO2 94%   BMI 41.64 kg/m  Wt Readings from Last 3 Encounters:  12/01/21 290 lb 3.2 oz (131.6 kg)  09/30/21 286 lb (129.7 kg)  07/27/21 280 lb (127 kg)      Physical Exam Constitutional:      General: He is not in acute distress.    Appearance: Normal appearance. He is not ill-appearing, toxic-appearing or diaphoretic.  HENT:     Head: Normocephalic and atraumatic.     Right  Ear: External ear normal.     Left Ear: External ear normal.     Mouth/Throat:     Mouth: Mucous membranes are moist.     Pharynx: Oropharynx is clear. No oropharyngeal exudate or posterior oropharyngeal erythema.  Eyes:     General: No scleral icterus.       Right eye: No discharge.        Left eye: No discharge.     Extraocular Movements: Extraocular movements intact.     Conjunctiva/sclera: Conjunctivae normal.     Pupils: Pupils are equal, round, and reactive to light.  Neck:   Cardiovascular:     Rate and Rhythm: Normal rate and regular rhythm.  Pulmonary:     Effort: Pulmonary effort is normal. No respiratory distress.     Breath sounds: Normal breath sounds.  Abdominal:     General: Bowel sounds are normal.  Musculoskeletal:     Cervical back: No rigidity or tenderness.  Lymphadenopathy:     Cervical: Cervical adenopathy present.     Right cervical: Deep cervical adenopathy present.  Skin:    General: Skin is warm and dry.  Neurological:     Mental Status: He is alert and oriented to person, place, and time.  Psychiatric:  Mood and Affect: Mood normal.        Behavior: Behavior normal.      No results found for any visits on 12/01/21.    The ASCVD Risk score (Arnett DK, et al., 2019) failed to calculate for the following reasons:   The valid total cholesterol range is 130 to 320 mg/dL    Assessment & Plan:   Problem List Items Addressed This Visit       Endocrine   Controlled type 2 diabetes mellitus without complication, without long-term current use of insulin ([place])   Relevant Orders   Ambulatory referral to Ophthalmology     Other   Morbid obesity ([place])   Neck mass - Primary   Relevant Orders   CBC w/Diff   CT Soft Tissue Neck W Contrast   Basic metabolic panel    Return Follow-up for scheduled appointment in November or sooner if needed..  Continue semaglutide.  He will consume his largest meal at lunchtime and eat lightly in the  evening when he returns home after 7:00 PM  [name], MD

## 2021-12-03 ENCOUNTER — Ambulatory Visit (HOSPITAL_BASED_OUTPATIENT_CLINIC_OR_DEPARTMENT_OTHER): Payer: 59

## 2021-12-05 ENCOUNTER — Telehealth (HOSPITAL_BASED_OUTPATIENT_CLINIC_OR_DEPARTMENT_OTHER): Payer: Self-pay

## 2022-01-01 ENCOUNTER — Telehealth (HOSPITAL_BASED_OUTPATIENT_CLINIC_OR_DEPARTMENT_OTHER): Payer: Self-pay

## 2022-01-08 ENCOUNTER — Other Ambulatory Visit: Payer: Self-pay | Admitting: Family Medicine

## 2022-01-08 DIAGNOSIS — E119 Type 2 diabetes mellitus without complications: Secondary | ICD-10-CM

## 2022-01-20 ENCOUNTER — Telehealth: Payer: Self-pay | Admitting: Family Medicine

## 2022-01-20 DIAGNOSIS — M1990 Unspecified osteoarthritis, unspecified site: Secondary | ICD-10-CM

## 2022-01-20 NOTE — Telephone Encounter (Signed)
Patient calling states that he has a terrible gout flare would like something called in for this last OV 12/01/21 patient states that he is not able to come in none this week or next but would like something sent in. Please advise

## 2022-01-20 NOTE — Telephone Encounter (Signed)
Caller Name: Pt Call back phone #: [phone]  Reason for Call: Pt said he has gout. He wanted Dr. Ethelene Hal to call in a med for it. I suggested making appt since he has not been here since 7/18 and he may not want to prescribe a med and has not seen the pt for this. He stated he does not have time. Thought I would send this note to let Dr Ethelene Hal make that decision.

## 2022-01-21 MED ORDER — INDOMETHACIN 50 MG PO CAPS: 50.0000 mg | ORAL_CAPSULE | Freq: Three times a day (TID) | ORAL | 1 refills | Status: DC

## 2022-01-21 NOTE — Telephone Encounter (Signed)
Patient aware meds sent in

## 2022-02-08 ENCOUNTER — Telehealth: Payer: Self-pay | Admitting: Family Medicine

## 2022-02-08 DIAGNOSIS — E119 Type 2 diabetes mellitus without complications: Secondary | ICD-10-CM

## 2022-02-08 MED ORDER — TIRZEPATIDE 5 MG/0.5ML ~~LOC~~ SOAJ: 5.0000 mg | SUBCUTANEOUS | 5 refills | Status: DC

## 2022-02-08 NOTE — Telephone Encounter (Signed)
Patient calling states that he have checked all pharmacies and none have Ozempic '1mg'$  patient out and does not know what else to do is there something else he can take? Please advise.

## 2022-02-08 NOTE — Telephone Encounter (Signed)
Spoke with pharmacist who states that they only have 0.25 of Ozempic can this be sent in for patient or different Rx?

## 2022-02-08 NOTE — Telephone Encounter (Signed)
Caller Name: Pt Call back phone #: [phone]  Reason for Call: [facility] [address] [facility] not [facility] of [facility]empic prescribed. They can fill it you change the dosage. He did not know if it was up or down.

## 2022-04-01 ENCOUNTER — Other Ambulatory Visit: Payer: Self-pay | Admitting: Family Medicine

## 2022-04-01 DIAGNOSIS — E119 Type 2 diabetes mellitus without complications: Secondary | ICD-10-CM

## 2022-06-06 ENCOUNTER — Emergency Department (HOSPITAL_BASED_OUTPATIENT_CLINIC_OR_DEPARTMENT_OTHER): Payer: 59 | Admitting: Radiology

## 2022-06-06 ENCOUNTER — Emergency Department (HOSPITAL_BASED_OUTPATIENT_CLINIC_OR_DEPARTMENT_OTHER)
Admission: EM | Admit: 2022-06-06 | Discharge: 2022-06-06 | Disposition: A | Discharge status: Home / Self Care | Payer: 59 | Attending: Emergency Medicine | Admitting: Emergency Medicine

## 2022-06-06 ENCOUNTER — Other Ambulatory Visit: Payer: Self-pay

## 2022-06-06 ENCOUNTER — Encounter (HOSPITAL_BASED_OUTPATIENT_CLINIC_OR_DEPARTMENT_OTHER): Payer: Self-pay | Admitting: *Deleted

## 2022-06-06 DIAGNOSIS — R079 Chest pain, unspecified: Secondary | ICD-10-CM | POA: Diagnosis present

## 2022-06-06 DIAGNOSIS — R0789 Other chest pain: Secondary | ICD-10-CM | POA: Insufficient documentation

## 2022-06-06 LAB — CBC
HCT: 44.1 % (ref 39.0–52.0)
Hemoglobin: 15.3 g/dL (ref 13.0–17.0)
MCH: 29.4 pg (ref 26.0–34.0)
MCHC: 34.7 g/dL (ref 30.0–36.0)
MCV: 84.6 fL (ref 80.0–100.0)
Platelets: 250 10*3/uL (ref 150–400)
RBC: 5.21 MIL/uL (ref 4.22–5.81)
RDW: 12.8 % (ref 11.5–15.5)
WBC: 10.1 10*3/uL (ref 4.0–10.5)
nRBC: 0 % (ref 0.0–0.2)

## 2022-06-06 LAB — BASIC METABOLIC PANEL
Anion gap: 8 (ref 5–15)
BUN: 17 mg/dL (ref 6–20)
CO2: 29 mmol/L (ref 22–32)
Calcium: 9.8 mg/dL (ref 8.9–10.3)
Chloride: 103 mmol/L (ref 98–111)
Creatinine, Ser: 1.15 mg/dL (ref 0.61–1.24)
GFR, Estimated: >60 mL/min (ref >60)
Glucose, Bld: 146 mg/dL — ABNORMAL HIGH (ref 70–99)
Potassium: 4 mmol/L (ref 3.5–5.1)
Sodium: 140 mmol/L (ref 135–145)

## 2022-06-06 LAB — TROPONIN I (HIGH SENSITIVITY)
Troponin I (High Sensitivity): 5 ng/L (ref <18)
Troponin I (High Sensitivity): 5 ng/L (ref <18)

## 2022-06-06 NOTE — ED Notes (Signed)
Delay in d/c d/t critical influx

## 2022-06-06 NOTE — ED Triage Notes (Addendum)
Pt c/o chest pain for past few weeks. Describes as dull and ongoing. States pain is sharp at times. Denies any n/v or sob. C/o occasional dizziness and tingling to his left arm. Has not taken anything for pain. Denies a cardiac hx.

## 2022-06-06 NOTE — Discharge Instructions (Signed)
He can appointment follow-up with cardiology.  Suspect they are going to want to do echocardiogram and nonexercise stress test.  Also recommend baby aspirin a day.  Today's workup without any acute findings.  Return for any new or worse symptoms.

## 2022-06-06 NOTE — ED Provider Notes (Addendum)
[facility] Provider Note   CSN: [id number] Arrival date & time: 06/06/22  6387     History  Chief Complaint  Patient presents with   Chest Pain    Jason Waters is a 52 y.o. male.  Patient with a 1 week history of intermittent upper left sternal chest pain that will radiate to the arm.  Usually only last for couple minutes.  But starting around midnight this started to be more frequent.  Patient thought maybe it was indigestion and took Tums no real relief.  Patient states happening more frequently.  No nausea or vomiting no shortness of breath.  Occasionally some tingling to the left arm.  Patient states he is taking a baby aspirin a day.  Patient was seen in March for similar pain given recommendation for follow-up with cardiology but patient was changing jobs at that time and has not seen cardiology.  Past medical history significant for diabetes and hyperlipidemia past surgical history significant for lumbar laminectomy in 2017 patient is never used tobacco products.       Home Medications Prior to Admission medications   Medication Sig Start Date End Date Taking? Authorizing Provider  ibuprofen (ADVIL) 800 MG tablet Take 800 mg by mouth 3 (three) times daily. 06/01/22  Yes [provider]  indomethacin (INDOCIN) 50 MG capsule Take 1 capsule (50 mg total) by mouth 3 (three) times daily with meals. For 7 days. 01/21/22   [name], MD  metFORMIN (GLUCOPHAGE-XR) 500 MG 24 hr tablet TAKE 1 TABLET BY MOUTH TWICE A DAY 04/01/22   [name], MD  olmesartan [facility]) 20 MG tablet TAKE 1/2 TABLET BY MOUTH DAILY 08/03/21   [name], MD  simvastatin (ZOCOR) 40 MG tablet Take 1 tablet (40 mg total) by mouth at bedtime. 10/15/21   [name], MD  tirzepatide [facility]) 5 MG/0.5ML Pen Inject 5 mg into the skin once a week. Additional pen needles as required 02/08/22   [name],  MD      Allergies    Bee venom    Review of Systems   Review of Systems  Constitutional:  Negative for chills and fever.  HENT:  Negative for ear pain and sore throat.   Eyes:  Negative for pain and visual disturbance.  Respiratory:  Negative for cough and shortness of breath.   Cardiovascular:  Positive for chest pain. Negative for palpitations.  Gastrointestinal:  Negative for abdominal pain and vomiting.  Genitourinary:  Negative for dysuria and hematuria.  Musculoskeletal:  Negative for arthralgias and back pain.  Skin:  Negative for color change and rash.  Neurological:  Negative for seizures and syncope.  All other systems reviewed and are negative.   Physical Exam Updated Vital Signs BP 133/69   Pulse 65   Temp 98.2 F (36.8 C) (Oral)   Resp 14   Ht 1.778 m ('5\' 10"'$ )   Wt (!) 139.7 kg   SpO2 98%   BMI 44.19 kg/m  Physical Exam Vitals and nursing note reviewed.  Constitutional:      General: He is not in acute distress.    Appearance: He is well-developed. He is not ill-appearing.  HENT:     Head: Normocephalic and atraumatic.  Eyes:     Conjunctiva/sclera: Conjunctivae normal.  Cardiovascular:     Rate and Rhythm: Normal rate and regular rhythm.     Heart sounds: No murmur heard. Pulmonary:  Effort: Pulmonary effort is normal. No respiratory distress.     Breath sounds: Normal breath sounds. No decreased breath sounds, wheezing, rhonchi or rales.  Abdominal:     Palpations: Abdomen is soft.     Tenderness: There is no abdominal tenderness.  Musculoskeletal:        General: No swelling.     Cervical back: Neck supple.     Right lower leg: No edema.     Left lower leg: No edema.  Skin:    General: Skin is warm and dry.     Capillary Refill: Capillary refill takes less than 2 seconds.  Neurological:     General: No focal deficit present.     Mental Status: He is alert and oriented to person, place, and time.  Psychiatric:        Mood and Affect:  Mood normal.     ED Results / Procedures / Treatments   Labs (all labs ordered are listed, but only abnormal results are displayed) Labs Reviewed  BASIC METABOLIC PANEL - Abnormal; Notable for the following components:      Result Value   Glucose, Bld 146 (*)    All other components within normal limits  CBC  TROPONIN I (HIGH SENSITIVITY)    EKG EKG Interpretation  Date/Time:  Sunday June 06 2022 06:31:42 EST Ventricular Rate:  75 PR Interval:  167 QRS Duration: 85 QT Interval:  350 QTC Calculation: 391 R Axis:   83 Text Interpretation: Sinus rhythm Confirmed by Fredia Sorrow [phone]) on 06/06/2022 7:49:12 AM  Radiology DG Chest 2 View  Result Date: 06/06/2022 CLINICAL DATA:  Chest pain EXAM: CHEST - 2 VIEW COMPARISON:  07/27/2021 FINDINGS: Normal heart size and mediastinal contours. No acute infiltrate or edema. No effusion or pneumothorax. No acute osseous findings. Artifact from EKG leads. IMPRESSION: No active cardiopulmonary disease. Electronically Signed   By: Jorje Guild M.D.   On: 06/06/2022 07:08    Procedures Procedures    Medications Ordered in ED Medications - No data to display  ED Course/ Medical Decision Making/ A&P                             Medical Decision Making Amount and/or Complexity of Data Reviewed Labs: ordered. Radiology: ordered.   Patient is lungs are clear.  EKG without any acute changes.  Chest x-ray without any acute findings.  Initial troponin normal at 5 CBC normal basic metabolic panel normal.  Including renal function.  Based on the fact that the symptoms got worse around midnight we will do a delta troponin patient does have risk factors.  If second troponin without significant change patient will need to follow-up outpatient with cardiology.  Patient understands that.  Also would recommend baby aspirin a day which patient has stated that he is doing.  No real concerns for pulmonary embolism.  When patient was seen  in March his workup was negative.  Troponin without any significant changes.  Will have patient follow-up with cardiology.  Final Clinical Impression(s) / ED Diagnoses Final diagnoses:  Atypical chest pain    Rx / DC Orders ED Discharge Orders     None         Fredia Sorrow, MD 06/06/22 [id number]    Fredia Sorrow, MD 06/06/22 [phone]

## 2022-06-07 ENCOUNTER — Telehealth: Payer: Self-pay

## 2022-06-07 NOTE — Telephone Encounter (Signed)
Transition Care Management Follow-up Telephone Call Date of discharge and from where: 06/06/22 [facility]. Dx: Atypical chest pain How have you been since you were released from the hospital? I'm doing the best I can. Any questions or concerns? No  Items Reviewed: Did the pt receive and understand the discharge instructions provided? Yes  Medications obtained and verified? No  Other? No  Any new allergies since your discharge? No  Dietary orders reviewed? No Do you have support at home? Yes    Follow up appointments reviewed:  PCP Hospital f/u appt confirmed? No  Scheduled to see n/a on n/a @ n/a. [facility] f/u appt confirmed? Yes  Scheduled to see Dr. Einar Gip on 06/16/22 @ 12:30pm. Are transportation arrangements needed? No  If their condition worsens, is the pt aware to call PCP or go to the Emergency Dept.? Yes Was the patient provided with contact information for the PCP's office or ED? Yes Was to pt encouraged to call back with questions or concerns? Yes  Angeline Slim, RN, BSN RN Clinical Supervisor [facility] [employer]

## 2022-06-16 ENCOUNTER — Ambulatory Visit: Payer: 59 | Admitting: Cardiology

## 2022-06-16 ENCOUNTER — Encounter: Payer: Self-pay | Admitting: Cardiology

## 2022-06-16 VITALS — BP 136/80 | HR 79 | Ht 70.0 in | Wt 298.0 lb

## 2022-06-16 DIAGNOSIS — R0683 Snoring: Secondary | ICD-10-CM

## 2022-06-16 DIAGNOSIS — E119 Type 2 diabetes mellitus without complications: Secondary | ICD-10-CM

## 2022-06-16 DIAGNOSIS — R072 Precordial pain: Secondary | ICD-10-CM

## 2022-06-16 DIAGNOSIS — E7800 Pure hypercholesterolemia, unspecified: Secondary | ICD-10-CM

## 2022-06-16 DIAGNOSIS — G4710 Hypersomnia, unspecified: Secondary | ICD-10-CM

## 2022-06-16 DIAGNOSIS — I10 Essential (primary) hypertension: Secondary | ICD-10-CM

## 2022-06-16 MED ORDER — MOUNJARO 2.5 MG/0.5ML ~~LOC~~ SOAJ: 2.5000 mg | SUBCUTANEOUS | 1 refills | Status: DC

## 2022-06-16 NOTE — Progress Notes (Signed)
Primary Physician/Referring:  [name], MD  Patient ID: Jason Waters, male    DOB: 02/19/1971, 52 y.o.   MRN: [id number]  Chief Complaint  Patient presents with   Chest Pain   New Patient (Initial Visit)   HPI:    Jason Waters  is a 52 y.o. Male patient with diabetes mellitus, hypertension, hypercholesterolemia, morbid obesity referred to me for evaluation of chest pain.  Patient was seen for chest pain in March 2023 and again in January 2024, he was ruled out for myocardial infarction and discharged home.  Patient describes his chest pain as sharp pains that come across his chest sometimes and sometimes in the left side of the chest lasting a few seconds, sometimes can last for hours.  Has been ongoing for several years.  He has had evaluation for the same pain year ago in the emergency room and again recently in January 2024.  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  Otherwise denies any dyspnea, leg edema, PND or orthopnea.  Does endorse loud snoring and daytime somnolence.  No syncope.  Past Medical History:  Diagnosis Date   Diabetes mellitus without complication ([place])    Hyperlipidemia    Hyperplastic rectal polyp    Medical history non-contributory    Neuromuscular disorder ([place])    left arm goes to sleep frequently    Perirectal fistula    Past Surgical History:  Procedure Laterality Date   HAND SURGERY Left    LUMBAR LAMINECTOMY/DECOMPRESSION MICRODISCECTOMY Right 07/09/2015   Procedure: RIGHT SIDED LUMBAR 5-SACRUM 1 MICRODISECTOMY;  Surgeon: Phylliss Bob, MD;  Location: [facility];  Service: Orthopedics;  Laterality: Right;  Right sided lumbar 5-sacrum 1 microdisectomy   POLYPECTOMY  02/2019   HPP rectal    RECTAL EXAM UNDER ANESTHESIA N/A 03/15/2019   Procedure: ANORECTAL EXAM UNDER ANESTHESIA, REPAIR OF PERIRECTAL FISTULA, POSSIBLE HEMORROIDECTOMY;  Surgeon:  Michael Boston, MD;  Location: [facility];  Service: General;  Laterality: N/A;   WISDOM TOOTH EXTRACTION     Family History  Problem Relation Age of Onset   Diabetes Father    Heart disease Father 83   Esophageal cancer Maternal Grandmother    Colon cancer Neg Hx    Colon polyps Neg Hx    Rectal cancer Neg Hx    Stomach cancer Neg Hx     Social History   Tobacco Use   Smoking status: Never   Smokeless tobacco: Never  Substance Use Topics   Alcohol use: Yes    Comment: rarely   Marital Status: Married  ROS  Review of Systems  Cardiovascular:  Positive for chest pain.  Respiratory:  Positive for snoring.    Objective      06/16/2022   12:29 PM 06/06/2022   10:15 AM 06/06/2022   10:00 AM  Vitals with BMI  Height '5\' 10"'$     Weight 298 lbs    BMI 85.02    Systolic 774 128 786  Diastolic 80 69 69  Pulse 79 59 60   SpO2: 96 %  Physical Exam Constitutional:      Appearance: He is morbidly obese.  Neck:     Vascular: No carotid bruit or JVD.  Cardiovascular:     Rate and Rhythm: Normal rate and regular rhythm.     Pulses: Intact distal pulses.     Heart sounds: Normal heart sounds. No murmur  heard.    No gallop.  Pulmonary:     Effort: Pulmonary effort is normal.     Breath sounds: Normal breath sounds.  Abdominal:     General: Bowel sounds are normal.     Palpations: Abdomen is soft.  Musculoskeletal:     Right lower leg: No edema.     Left lower leg: No edema.     Medications and allergies   Allergies  Allergen Reactions   Bee Venom Swelling     Medication list   Current Outpatient Medications:    ibuprofen (ADVIL) 800 MG tablet, Take 800 mg by mouth 3 (three) times daily., Disp: , Rfl:    metFORMIN (GLUCOPHAGE-XR) 500 MG 24 hr tablet, TAKE 1 TABLET BY MOUTH TWICE A DAY, Disp: 180 tablet, Rfl: 1   olmesartan (BENICAR) 20 MG tablet, TAKE 1/2 TABLET BY MOUTH DAILY, Disp: 90 tablet, Rfl: 1   simvastatin (ZOCOR) 40 MG tablet, Take 1 tablet (40 mg total) by  mouth at bedtime., Disp: 90 tablet, Rfl: 3   tirzepatide (MOUNJARO) 7.5 MG/0.5ML Pen, Inject 7.5 mg into the skin once a week., Disp: 2 mL, Rfl: 1 Laboratory examination:   Recent Labs    07/27/21 0019 09/30/21 0855 12/01/21 0921 06/06/22 0643  NA 139 139 138 140  K 3.8 4.3 4.2 4.0  CL 103 104 104 103  CO2 '28 28 26 29  '$ GLUCOSE 149* 100* 102* 146*  BUN '18 20 19 17  '$ CREATININE 1.13 0.87 0.94 1.15  CALCIUM 9.4 9.7 9.1 9.8  GFRNONAA >60  --   --  >60    Lab Results  Component Value Date   GLUCOSE 146 (H) 06/06/2022   NA 140 06/06/2022   K 4.0 06/06/2022   CL 103 06/06/2022   CO2 29 06/06/2022   BUN 17 06/06/2022   CREATININE 1.15 06/06/2022   GFRNONAA >60 06/06/2022   CALCIUM 9.8 06/06/2022   PROT 7.1 09/30/2021   ALBUMIN 4.5 09/30/2021   BILITOT 0.8 09/30/2021   ALKPHOS 64 09/30/2021   AST 20 09/30/2021   ALT 24 09/30/2021   ANIONGAP 8 06/06/2022      Lab Results  Component Value Date   ALT 24 09/30/2021   AST 20 09/30/2021   ALKPHOS 64 09/30/2021   BILITOT 0.8 09/30/2021       Latest Ref Rng & Units 09/30/2021    8:55 AM 04/02/2021    2:08 PM 01/04/2020    4:36 PM  Hepatic Function  Total Protein 6.0 - 8.3 g/dL 7.1  6.8  6.7   Albumin 3.5 - 5.2 g/dL 4.5  4.5    AST 0 - 37 U/L '20  20  31   '$ ALT 0 - 53 U/L 24  35  55   Alk Phosphatase 39 - 117 U/L 64  71    Total Bilirubin 0.2 - 1.2 mg/dL 0.8  0.8  0.6     Lipid Panel Recent Labs    09/30/21 0855  CHOL 120  TRIG 128.0  LDLCALC 54  VLDL 25.6  HDL 40.40  CHOLHDL 3    HEMOGLOBIN A1C Lab Results  Component Value Date   HGBA1C 5.4 09/30/2021   MPG 186 01/04/2020   Lab Results  Component Value Date   TSH 2.84 03/22/2018    Radiology:    Cardiac Studies:   NA EKG:   EKG 06/16/2022: Normal sinus rhythm at the rate of 80 bpm, normal EKG.    Assessment  ICD-10-CM   1. Precordial pain  R07.2 EKG 12-Lead    PCV ECHOCARDIOGRAM COMPLETE    2. Primary hypertension  I10 PCV  ECHOCARDIOGRAM COMPLETE    3. Type 2 diabetes mellitus without complication, without long-term current use of insulin (HCC)  E11.9 CT CARDIAC SCORING ([facility] LOCATIONS ONLY)    tirzepatide (MOUNJARO) 7.5 MG/0.5ML Pen    DISCONTINUED: tirzepatide (MOUNJARO) 2.5 MG/0.5ML Pen    4. Class 3 severe obesity due to excess calories with serious comorbidity and body mass index (BMI) of 40.0 to 44.9 in adult [facility])  E66.01 Ambulatory referral to Sleep Studies   Z68.41     5. Pure hypercholesterolemia  E78.00 CT CARDIAC SCORING ([facility] LOCATIONS ONLY)    6. Loud snoring  R06.83 Ambulatory referral to Sleep Studies    7. Somnolence, daytime, controlled  G47.10 Ambulatory referral to Sleep Studies       Orders Placed This Encounter  Procedures   CT CARDIAC SCORING ([facility] LOCATIONS ONLY)    Standing Status:   Future    Standing Expiration Date:   08/15/2022    Order Specific Question:   Preferred imaging location?    Answer:   [facility]   Ambulatory referral to Sleep Studies    Referral Priority:   Routine    Referral Type:   Consultation    Referral Reason:   Specialty Services Required    Number of Visits Requested:   1   EKG 12-Lead   PCV ECHOCARDIOGRAM COMPLETE    Standing Status:   Future    Standing Expiration Date:   06/17/2023    Meds ordered this encounter  Medications   DISCONTD: tirzepatide (MOUNJARO) 2.5 MG/0.5ML Pen    Sig: Inject 2.5 mg into the skin once a week.    Dispense:  2 mL    Refill:  1   tirzepatide (MOUNJARO) 7.5 MG/0.5ML Pen    Sig: Inject 7.5 mg into the skin once a week.    Dispense:  2 mL    Refill:  1    Medications Discontinued During This Encounter  Medication Reason   indomethacin (INDOCIN) 50 MG capsule Completed Course   0.9 %  sodium chloride infusion    0.9 %  sodium chloride infusion    tirzepatide (MOUNJARO) 5 MG/0.5ML Pen Dose change   tirzepatide Darcel Bayley) 2.5 MG/0.5ML Pen Entry Error     Recommendations:   Jason Waters is a 52 y.o. Male  patient with diabetes mellitus, hypertension, hypercholesterolemia, morbid obesity referred to me for evaluation of chest pain.  Patient was seen for chest pain in March 2023 and again in January 2024, he was ruled out for myocardial infarction and discharged home.  Patient's father Mr. Jason Waters is a patient of mine.  1. Precordial pain Patient chest pain symptoms are clearly musculoskeletal.  I am not too concerned about his chest pain as I am concerned about his cardiovascular risks.  I will set him up for coronary calcium score and an echocardiogram.  2. Primary hypertension Blood pressure is well-controlled on olmesartan 20 mg daily, renal function is normal, continue the same.  3. Type 2 diabetes mellitus without complication, without long-term current use of insulin (Jason Waters) Patient has type 2 diabetes mellitus, probably related to excess calories and obesity.  He probably has insulin resistance.  He was recently started on Mounjaro, but he is only on minimal dose.  I had a long discussion with the patient that he forward to increase his dose to  2.4 mg, he probably will have significant amount of nausea and intolerance to the medication, hence eating slow, taking a break halfway through his meal for about 10 to 15 minutes to include satiety and to reduce food intake discussed with the patient and he appears to be motivated.  I also discussed with him regarding reducing fatty meal to decrease the risk of side effects.  Addendum: Patient was on 5 mg of Mounjaro, not 0.5.  Hence a new prescription was sent for 7.5 mg.  4. Class 3 severe obesity due to excess calories with serious comorbidity and body mass index (BMI) of 40.0 to 44.9 in adult [facility]) Patient has very poor eating habits, essentially eats [employer] breakfast in the morning, eats hamburgers in the afternoon for lunch and eats homemade food about 2-3 times a week otherwise usually order out or go out.  He now appears to be motivated  for weight loss.  Hopefully Darcel Bayley will also help.  5. Pure hypercholesterolemia I reviewed his external labs, lipids in excellent control, no changes were done today.  6. Loud snoring and daytime somnolence.  Patient has loud snoring and daytime somnolence, states that if he stops doing anything he can fall asleep immediately.  Will schedule him for sleep consultation.  I will see him back in 6 weeks for follow-up.  If he tolerates higher dose of Mounjaro, we will continue to uptitrate this and will forward this note to Dr. Alfonso Ramus so he can take over the management of the same unless there is significant cardiac issues.    Adrian Prows, MD, [facility] 06/17/2022, 1:05 PM Office: [phone]

## 2022-06-17 MED ORDER — MOUNJARO 7.5 MG/0.5ML ~~LOC~~ SOAJ: 7.5000 mg | SUBCUTANEOUS | 1 refills | Status: DC

## 2022-06-17 NOTE — Addendum Note (Signed)
Addended by: Kela Millin on: 06/17/2022 01:05 PM   Modules accepted: Orders

## 2022-06-28 ENCOUNTER — Ambulatory Visit: Payer: 59

## 2022-06-28 DIAGNOSIS — R072 Precordial pain: Secondary | ICD-10-CM

## 2022-06-28 DIAGNOSIS — I10 Essential (primary) hypertension: Secondary | ICD-10-CM

## 2022-07-01 IMAGING — DX DG CHEST 1V PORT
1 series · 1 of 1 positions shown · non-contrast
Comparison: Chest radiograph dated 07/03/2015.

CLINICAL DATA: Chest pain.

EXAM:
PORTABLE CHEST 1 VIEW

[chest ap]
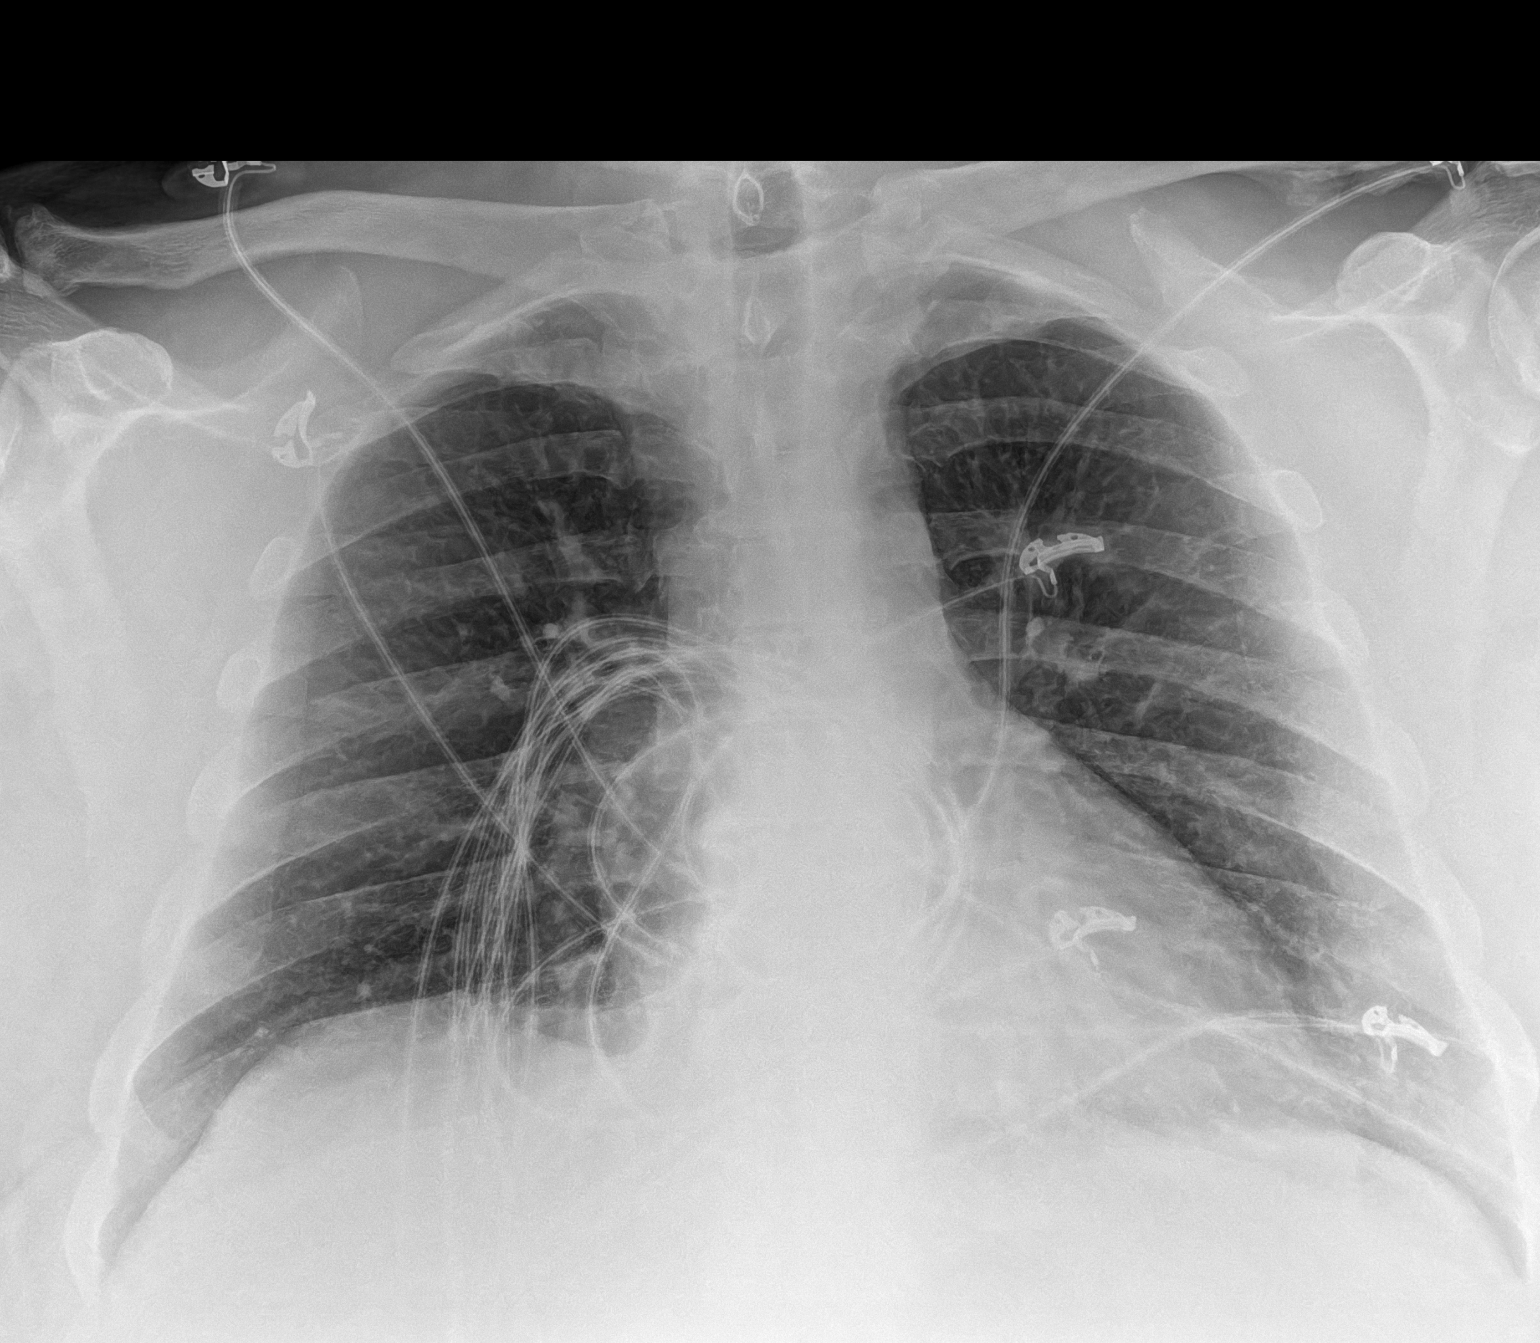

[1 of 1 positions shown; findings below may reference images not displayed]

FINDINGS: The heart size and mediastinal contours are within normal limits.
Both lungs are clear. The visualized skeletal structures are
unremarkable.
IMPRESSION: No active disease.

## 2022-07-14 ENCOUNTER — Telehealth: Payer: Self-pay | Admitting: Cardiology

## 2022-07-16 ENCOUNTER — Other Ambulatory Visit: Payer: Self-pay | Admitting: Family Medicine

## 2022-07-16 DIAGNOSIS — E119 Type 2 diabetes mellitus without complications: Secondary | ICD-10-CM

## 2022-07-20 ENCOUNTER — Other Ambulatory Visit: Payer: Self-pay | Admitting: Cardiology

## 2022-07-20 DIAGNOSIS — E119 Type 2 diabetes mellitus without complications: Secondary | ICD-10-CM

## 2022-07-28 ENCOUNTER — Other Ambulatory Visit: Payer: Self-pay | Admitting: Cardiology

## 2022-07-28 DIAGNOSIS — E119 Type 2 diabetes mellitus without complications: Secondary | ICD-10-CM

## 2022-08-09 ENCOUNTER — Ambulatory Visit: Payer: 59 | Admitting: Cardiology

## 2022-08-09 ENCOUNTER — Encounter: Payer: Self-pay | Admitting: Cardiology

## 2022-08-09 VITALS — BP 113/54 | HR 72 | Resp 16 | Ht 70.0 in | Wt 291.0 lb

## 2022-08-09 DIAGNOSIS — E119 Type 2 diabetes mellitus without complications: Secondary | ICD-10-CM

## 2022-08-09 DIAGNOSIS — R931 Abnormal findings on diagnostic imaging of heart and coronary circulation: Secondary | ICD-10-CM

## 2022-08-09 DIAGNOSIS — I10 Essential (primary) hypertension: Secondary | ICD-10-CM

## 2022-08-09 DIAGNOSIS — Z6841 Body Mass Index (BMI) 40.0 and over, adult: Secondary | ICD-10-CM

## 2022-08-09 DIAGNOSIS — R072 Precordial pain: Secondary | ICD-10-CM

## 2022-08-09 MED ORDER — ASPIRIN 81 MG PO CHEW: 81.0000 mg | CHEWABLE_TABLET | Freq: Every day | ORAL | Status: AC

## 2022-08-09 MED ORDER — MOUNJARO 7.5 MG/0.5ML ~~LOC~~ SOAJ: SUBCUTANEOUS | 3 refills | Status: DC

## 2022-08-09 NOTE — Progress Notes (Signed)
Primary Physician/Referring:  [name], MD  Patient ID: Jason Waters, male    DOB: Oct 30, 1970, 52 y.o.   MRN: [id number]  Chief Complaint  Patient presents with   Precordial pain   Follow-up   HPI:    Jason Waters  is a 52 y.o. Male patient with diabetes mellitus, hypertension, hypercholesterolemia, morbid obesity referred to me for evaluation of chest pain.  Patient was seen for chest pain in March 2023 and again in January 2024, he was ruled out for myocardial infarction and discharged home.  Patient describes his chest pain as sharp pains that come across his chest sometimes and sometimes in the left side of the chest lasting a few seconds, sometimes can last for hours.  He has had evaluation for the same pain year ago in the emergency room and again recently in January 2024.  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  This does not stop him from doing physical activity.  Otherwise denies any dyspnea, leg edema, PND or orthopnea.  Does endorse loud snoring and daytime somnolence.  No syncope.  Past Medical History:  Diagnosis Date   Diabetes mellitus without complication ([place])    Hyperlipidemia    Hyperplastic rectal polyp    Medical history non-contributory    Neuromuscular disorder ([place])    left arm goes to sleep frequently    Perirectal fistula    Past Surgical History:  Procedure Laterality Date   HAND SURGERY Left    LUMBAR LAMINECTOMY/DECOMPRESSION MICRODISCECTOMY Right 07/09/2015   Procedure: RIGHT SIDED LUMBAR 5-SACRUM 1 MICRODISECTOMY;  Surgeon: Phylliss Bob, MD;  Location: [place];  Service: Orthopedics;  Laterality: Right;  Right sided lumbar 5-sacrum 1 microdisectomy   POLYPECTOMY  02/2019   HPP rectal    RECTAL EXAM UNDER ANESTHESIA N/A 03/15/2019   Procedure: ANORECTAL EXAM UNDER ANESTHESIA, REPAIR OF PERIRECTAL FISTULA,  POSSIBLE HEMORROIDECTOMY;  Surgeon: Michael Boston, MD;  Location[facility];  Service: General;  Laterality: N/A;   WISDOM TOOTH EXTRACTION     Family History  Problem Relation Age of Onset   Diabetes Father    Heart disease Father 50   Esophageal cancer Maternal Grandmother    Colon cancer Neg Hx    Colon polyps Neg Hx    Rectal cancer Neg Hx    Stomach cancer Neg Hx     Social History   Tobacco Use   Smoking status: Never   Smokeless tobacco: Never  Substance Use Topics   Alcohol use: Yes    Comment: rarely   Marital Status: Married  ROS  Review of Systems  Cardiovascular:  Positive for chest pain.  Respiratory:  Positive for snoring.    Objective      08/09/2022   12:40 PM 06/16/2022   12:29 PM 06/06/2022   10:15 AM  Vitals with BMI  Height 5\' 10"  5\' 10"    Weight 291 lbs 298 lbs   BMI 123456 [id number]   Systolic 123456 [id number]  Diastolic 54 80 69  Pulse 72 79 59   SpO2: 97 %  Physical Exam Constitutional:      Appearance: He is morbidly obese.  Neck:     Vascular: No carotid bruit or JVD.  Cardiovascular:     Rate and Rhythm: Normal rate and regular rhythm.     Pulses: Intact distal pulses.     Heart  sounds: Normal heart sounds. No murmur heard.    No gallop.  Pulmonary:     Effort: Pulmonary effort is normal.     Breath sounds: Normal breath sounds.  Abdominal:     General: Bowel sounds are normal.     Palpations: Abdomen is soft.  Musculoskeletal:     Right lower leg: No edema.     Left lower leg: No edema.    Laboratory examination:   Recent Labs    09/30/21 0855 12/01/21 0921 06/06/22 0643  NA 139 138 140  K 4.3 4.2 4.0  CL 104 104 103  CO2 28 26 29   GLUCOSE 100* 102* 146*  BUN 20 19 17   CREATININE 0.87 0.94 1.15  CALCIUM 9.7 9.1 9.8  GFRNONAA  --   --  >60    Lab Results  Component Value Date   GLUCOSE 146 (H) 06/06/2022   NA 140 06/06/2022   K 4.0 06/06/2022   CL 103 06/06/2022   CO2 29 06/06/2022   BUN 17 06/06/2022   CREATININE  1.15 06/06/2022   GFRNONAA >60 06/06/2022   CALCIUM 9.8 06/06/2022   PROT 7.1 09/30/2021   ALBUMIN 4.5 09/30/2021   BILITOT 0.8 09/30/2021   ALKPHOS 64 09/30/2021   AST 20 09/30/2021   ALT 24 09/30/2021   ANIONGAP 8 06/06/2022      Lab Results  Component Value Date   ALT 24 09/30/2021   AST 20 09/30/2021   ALKPHOS 64 09/30/2021   BILITOT 0.8 09/30/2021       Latest Ref Rng & Units 09/30/2021    8:55 AM 04/02/2021    2:08 PM 01/04/2020    4:36 PM  Hepatic Function  Total Protein 6.0 - 8.3 g/dL 7.1  6.8  6.7   Albumin 3.5 - 5.2 g/dL 4.5  4.5    AST 0 - 37 U/L 20  20  31    ALT 0 - 53 U/L 24  35  55   Alk Phosphatase 39 - 117 U/L 64  71    Total Bilirubin 0.2 - 1.2 mg/dL 0.8  0.8  0.6     Lipid Panel Recent Labs    09/30/21 0855  CHOL 120  TRIG 128.0  LDLCALC 54  VLDL 25.6  HDL 40.40  CHOLHDL 3    HEMOGLOBIN A1C Lab Results  Component Value Date   HGBA1C 5.4 09/30/2021   MPG 186 01/04/2020   Lab Results  Component Value Date   TSH 2.84 03/22/2018    Radiology:    Cardiac Studies:   Coronary calcium score 07/12/2022 - Total Calcium Score: 210.  -- Left Main: 0.  -- Left Anterior Descending: 198.  -- Circumflex: 2.  -- Right Coronary: 10  1.  Total calcium score of 210 is between the 75th and 90th percentile for males between the ages of 83 and 21 based on the [facility] database.  2.  Incidental 5 mm subpleural solid pulmonary nodule within the right lower lobe. If the patient has risk factors for lung cancer, consider a follow-up chest CT in one year to ensure stability.   PCV ECHOCARDIOGRAM COMPLETE 06/28/2022  Narrative Echocardiogram 06/28/2022: Normal LV systolic function with visual EF 55-60%. Left ventricle cavity is normal in size. Mild concentric hypertrophy of the left ventricle. Normal global wall motion. Normal diastolic filling pattern, normal LAP. Calculated EF 66%. Structurally normal tricuspid valve with trace regurgitation. No evidence of  pulmonary hypertension. No prior available for comparison.    EKG:  EKG 06/16/2022: Normal sinus rhythm at the rate of 80 bpm, normal EKG.      Medications and allergies   Allergies  Allergen Reactions   Bee Venom Swelling    Medication list    Current Outpatient Medications:    aspirin (ASPIRIN CHILDRENS) 81 MG chewable tablet, Chew 1 tablet (81 mg total) by mouth daily., Disp: , Rfl:    metFORMIN (GLUCOPHAGE-XR) 500 MG 24 hr tablet, TAKE 1 TABLET BY MOUTH TWICE A DAY, Disp: 180 tablet, Rfl: 1   olmesartan (BENICAR) 20 MG tablet, TAKE 1/2 TABLET BY MOUTH EVERY DAY, Disp: 15 tablet, Rfl: 11   simvastatin (ZOCOR) 40 MG tablet, Take 1 tablet (40 mg total) by mouth at bedtime., Disp: 90 tablet, Rfl: 3   tirzepatide (MOUNJARO) 7.5 MG/0.5ML Pen, INJECT 7.5 MG SUBCUTANEOUSLY WEEKLY, Disp: 6 mL, Rfl: 3   Assessment     ICD-10-CM   1. Precordial pain  R07.2 PCV MYOCARDIAL PERFUSION WO LEXISCAN    2. Elevated coronary artery calcium score 07/12/2022: Total calcium scoref 210 between the 75th and 90th percentile  R93.1 aspirin (ASPIRIN CHILDRENS) 81 MG chewable tablet    PCV MYOCARDIAL PERFUSION WO LEXISCAN    3. Class 3 severe obesity due to excess calories with serious comorbidity and body mass index (BMI) of 40.0 to 44.9 in adult (HCC)  E66.01    Z68.41     4. Type 2 diabetes mellitus without complication, without long-term current use of insulin (HCC)  E11.9 tirzepatide (MOUNJARO) 7.5 MG/0.5ML Pen    PCV MYOCARDIAL PERFUSION WO LEXISCAN    5. Primary hypertension  I10        Orders Placed This Encounter  Procedures   PCV MYOCARDIAL PERFUSION WO LEXISCAN    Standing Status:   Future    Standing Expiration Date:   10/09/2022    Meds ordered this encounter  Medications   tirzepatide (MOUNJARO) 7.5 MG/0.5ML Pen    Sig: INJECT 7.5 MG SUBCUTANEOUSLY WEEKLY    Dispense:  6 mL    Refill:  3   aspirin (ASPIRIN CHILDRENS) 81 MG chewable tablet    Sig: Chew 1 tablet (81 mg  total) by mouth daily.    Medications Discontinued During This Encounter  Medication Reason   tirzepatide (MOUNJARO) 7.5 MG/0.5ML Pen Reorder   ibuprofen (ADVIL) 800 MG tablet No longer needed (for PRN medications)     Recommendations:   BAINE BRESTER is a 52 y.o. Male patient with diabetes mellitus, hypertension, hypercholesterolemia, morbid obesity referred to me for evaluation of chest pain.  Patient was seen for chest pain in March 2023 and again in January 2024, he was ruled out for myocardial infarction and discharged home.  Patient's father Mr. Voyd Mehnert is a patient of mine.  1. Precordial pain Patient chest pain appears to be mostly musculoskeletal but in view of his underlying cardiac risk factors, cannot exclude significant CAD.  Will schedule him for exercise nuclear stress test.  I reviewed the results with the echocardiogram, normal LVEF.  I may have to add beta-blocker therapy, however would like to wait for the stress test to be performed to further stratify prior to starting a beta-blocker.  Presently on ARB for diabetes mellitus and hypertension. - PCV MYOCARDIAL PERFUSION WO LEXISCAN; Future  2. Elevated coronary artery calcium score 07/12/2022: Total calcium scoref 210 between the 75th and 90th percentile Discussed the findings of the elevated coronary calcium score placing him in the 75th to the 90th percentile.  He  is presently on simvastatin and lipids are at goal.  I have added aspirin 81 mg daily. - aspirin (ASPIRIN CHILDRENS) 81 MG chewable tablet; Chew 1 tablet (81 mg total) by mouth daily. - PCV MYOCARDIAL PERFUSION WO LEXISCAN; Future  3. Class 3 severe obesity due to excess calories with serious comorbidity and body mass index (BMI) of 40.0 to 44.9 in adult [facility]) He has lost about 15 pounds in weight so far, presently on Mounjaro and having difficulty obtaining 7.5 mg dose, he has gone back to taking 5 mg, will try mail-order pharmacy.  4. Type 2  diabetes mellitus without complication, without long-term current use of insulin ([place]) Again Mounjaro was prescribed both for weight loss and for diabetes hopefully he will be able to obtain higher dose at 7.5 mg. - tirzepatide (MOUNJARO) 7.5 MG/0.5ML Pen; INJECT 7.5 MG SUBCUTANEOUSLY WEEKLY  Dispense: 6 mL; Refill: 3 - PCV MYOCARDIAL PERFUSION WO LEXISCAN; Future  5. Primary hypertension Blood pressure very well-controlled.  Continue ARB.  I may have to consider addition of a beta-blocker depending upon the stress test.  I would like to see him back in 3 months for follow-up or for weight loss, risk factor modification and to follow-up on chest pain.  He was also referred for sleep study, will follow-up on this on his next office visit.   Adrian Prows, MD, [facility] 08/09/2022, 1:30 PM Office: [phone]

## 2022-08-23 ENCOUNTER — Ambulatory Visit: Payer: 59

## 2022-08-23 DIAGNOSIS — R072 Precordial pain: Secondary | ICD-10-CM

## 2022-08-23 DIAGNOSIS — R931 Abnormal findings on diagnostic imaging of heart and coronary circulation: Secondary | ICD-10-CM

## 2022-08-23 DIAGNOSIS — E119 Type 2 diabetes mellitus without complications: Secondary | ICD-10-CM

## 2022-08-31 ENCOUNTER — Other Ambulatory Visit: Payer: Self-pay

## 2022-08-31 DIAGNOSIS — E119 Type 2 diabetes mellitus without complications: Secondary | ICD-10-CM

## 2022-08-31 MED ORDER — MOUNJARO 7.5 MG/0.5ML ~~LOC~~ SOAJ: SUBCUTANEOUS | 3 refills | Status: DC

## 2022-09-03 ENCOUNTER — Encounter: Payer: Self-pay | Admitting: Gastroenterology

## 2022-09-29 ENCOUNTER — Encounter: Payer: Self-pay | Admitting: Gastroenterology

## 2022-09-29 ENCOUNTER — Ambulatory Visit (AMBULATORY_SURGERY_CENTER): Payer: 59 | Admitting: *Deleted

## 2022-09-29 VITALS — Ht 70.0 in | Wt 287.0 lb

## 2022-09-29 DIAGNOSIS — Z86010 Personal history of colonic polyps: Secondary | ICD-10-CM

## 2022-09-29 MED ORDER — NA SULFATE-K SULFATE-MG SULF 17.5-3.13-1.6 GM/177ML PO SOLN: 1.0000 | Freq: Once | ORAL | 0 refills | Status: AC

## 2022-09-29 NOTE — Progress Notes (Signed)
Pt's name and DOB verified at the beginning of the pre-visit.  Pt denies any difficulty with ambulating,sitting, laying down or rolling side to side Gave both [facility] main # and MD on call # prior to instructions.  No egg or soy allergy known to patient  No issues known to pt with past sedation with any surgeries or procedures Pt denies having issues being intubated Pt has no issues moving head neck or swallowing No FH of Malignant Hyperthermia Pt is not on diet pills Pt is not on home 02  Pt is not on blood thinners  Pt denies issues with constipation  Pt is not on dialysis Pt denise any abnormal heart rhythms  Pt denies any upcoming cardiac testing Pt encouraged to use to use Singlecare or Goodrx to reduce cost  Patient's chart reviewed by Cathlyn Parsons CNRA prior to pre-visit and patient appropriate for the [facility].  Pre-visit completed and red dot placed by patient's name on their procedure day (on provider's schedule).  . Visit by phone Pt states weight is 287 lb Instructed pt why it is important to and  to call if they have any changes in health or new medications. Directed them to the # given and on instructions.   Pt states they will.  Instructions reviewed with pt and pt states understanding. Instructed to review again prior to procedure. Pt states they will.  Instructions sent by mail with coupon and by my chart

## 2022-10-06 ENCOUNTER — Other Ambulatory Visit: Payer: Self-pay | Admitting: Family Medicine

## 2022-10-06 DIAGNOSIS — E7800 Pure hypercholesterolemia, unspecified: Secondary | ICD-10-CM

## 2022-10-19 ENCOUNTER — Ambulatory Visit (AMBULATORY_SURGERY_CENTER): Payer: 59 | Admitting: Gastroenterology

## 2022-10-19 ENCOUNTER — Encounter: Payer: Self-pay | Admitting: Gastroenterology

## 2022-10-19 VITALS — BP 132/66 | HR 57 | Temp 97.8°F | Resp 14 | Ht 70.0 in | Wt 287.0 lb

## 2022-10-19 DIAGNOSIS — Z86010 Personal history of colonic polyps: Secondary | ICD-10-CM | POA: Diagnosis not present

## 2022-10-19 DIAGNOSIS — Z09 Encounter for follow-up examination after completed treatment for conditions other than malignant neoplasm: Secondary | ICD-10-CM

## 2022-10-19 DIAGNOSIS — D123 Benign neoplasm of transverse colon: Secondary | ICD-10-CM | POA: Diagnosis not present

## 2022-10-19 DIAGNOSIS — K5520 Angiodysplasia of colon without hemorrhage: Secondary | ICD-10-CM

## 2022-10-19 DIAGNOSIS — D122 Benign neoplasm of ascending colon: Secondary | ICD-10-CM

## 2022-10-19 MED ORDER — SODIUM CHLORIDE 0.9 % IV SOLN: 500.0000 mL | Freq: Once | INTRAVENOUS | Status: DC

## 2022-10-19 NOTE — Progress Notes (Signed)
Sedate, gd SR's, VSS, report to RN 

## 2022-10-19 NOTE — Patient Instructions (Addendum)
Resume previous diet Continue present medications Await pathology results Handouts/information given for polyps and hemorrhoids  YOU HAD AN ENDOSCOPIC PROCEDURE TODAY AT THE [facility]:   Refer to the procedure report that was given to you for any specific questions about what was found during the examination.  If the procedure report does not answer your questions, please call your gastroenterologist to clarify.  If you requested that your care partner not be given the details of your procedure findings, then the procedure report has been included in a sealed envelope for you to review at your convenience later.  YOU SHOULD EXPECT: Some feelings of bloating in the abdomen. Passage of more gas than usual.  Walking can help get rid of the air that was put into your GI tract during the procedure and reduce the bloating. If you had a lower endoscopy (such as a colonoscopy or flexible sigmoidoscopy) you may notice spotting of blood in your stool or on the toilet paper. If you underwent a bowel prep for your procedure, you may not have a normal bowel movement for a few days.  Please Note:  You might notice some irritation and congestion in your nose or some drainage.  This is from the oxygen used during your procedure.  There is no need for concern and it should clear up in a day or so.  SYMPTOMS TO REPORT IMMEDIATELY:  Following lower endoscopy (colonoscopy):  Excessive amounts of blood in the stool  Significant tenderness or worsening of abdominal pains  Swelling of the abdomen that is new, acute  Fever of 100F or higher  For urgent or emergent issues, a gastroenterologist can be reached at any hour by calling [phone]. Do not use MyChart messaging for urgent concerns.   DIET:  We do recommend a small meal at first, but then you may proceed to your regular diet.  Drink plenty of fluids but you should avoid alcoholic beverages for 24 hours.  ACTIVITY:  You should plan to take  it easy for the rest of today and you should NOT DRIVE or use heavy machinery until tomorrow (because of the sedation medicines used during the test).    FOLLOW UP: Our staff will call the number listed on your records the next business day following your procedure.  We will call around 7:15- 8:00 am to check on you and address any questions or concerns that you may have regarding the information given to you following your procedure. If we do not reach you, we will leave a message.     If any biopsies were taken you will be contacted by phone or by letter within the next 1-3 weeks.  Please call us at [phone] if you have not heard about the biopsies in 3 weeks.    SIGNATURES/CONFIDENTIALITY: You and/or your care partner have signed paperwork which will be entered into your electronic medical record.  These signatures attest to the fact that that the information above on your After Visit Summary has been reviewed and is understood.  Full responsibility of the confidentiality of this discharge information lies with you and/or your care-partner. 

## 2022-10-19 NOTE — Progress Notes (Signed)
Called to room to assist during endoscopic procedure.  Patient ID and intended procedure confirmed with present staff. Received instructions for my participation in the procedure from the performing physician.  

## 2022-10-19 NOTE — Progress Notes (Signed)
[facility] History and Physical   Primary Care Physician:  Mliss Sax, MD   Reason for Procedure:   History of colon polyps  Plan:    colonoscopy     HPI: Jason Waters is a 52 y.o. male  here for colonoscopy surveillance - multiple adenomas removed 08/2019.   Patient denies any bowel symptoms at this time. No family history of colon cancer known. Otherwise feels well without any cardiopulmonary symptoms.   I have discussed risks / benefits of anesthesia and endoscopic procedure with Jason Waters and they wish to proceed with the exams as outlined today.    Past Medical History:  Diagnosis Date   Diabetes mellitus without complication (HCC)    Hyperlipidemia    Hyperplastic rectal polyp    Hypertension    Medical history non-contributory    Neuromuscular disorder (HCC)    left arm goes to sleep frequently    Perirectal fistula     Past Surgical History:  Procedure Laterality Date   HAND SURGERY Left    LUMBAR LAMINECTOMY/DECOMPRESSION MICRODISCECTOMY Right 07/09/2015   Procedure: RIGHT SIDED LUMBAR 5-SACRUM 1 MICRODISECTOMY;  Surgeon: Estill Bamberg, MD;  Location: [facility] OR;  Service: Orthopedics;  Laterality: Right;  Right sided lumbar 5-sacrum 1 microdisectomy   POLYPECTOMY  02/2019   HPP rectal    RECTAL EXAM UNDER ANESTHESIA N/A 03/15/2019   Procedure: ANORECTAL EXAM UNDER ANESTHESIA, REPAIR OF PERIRECTAL FISTULA, POSSIBLE HEMORROIDECTOMY;  Surgeon: Karie Soda, MD;  Location: [facility];  Service: General;  Laterality: N/A;   WISDOM TOOTH EXTRACTION      Prior to Admission medications   Medication Sig Start Date End Date Taking? Authorizing Provider  aspirin (ASPIRIN CHILDRENS) 81 MG chewable tablet Chew 1 tablet (81 mg total) by mouth daily. 08/09/22  Yes Yates Decamp, MD  metFORMIN (GLUCOPHAGE-XR) 500 MG 24 hr tablet TAKE 1 TABLET BY MOUTH TWICE A DAY 04/01/22  Yes Mliss Sax, MD  olmesartan (BENICAR) 20 MG tablet TAKE 1/2  TABLET BY MOUTH EVERY DAY 07/16/22  Yes Mliss Sax, MD  simvastatin (ZOCOR) 40 MG tablet TAKE 1 TABLET BY MOUTH EVERYDAY AT BEDTIME 10/06/22  Yes Mliss Sax, MD  tirzepatide [facility]) 7.5 MG/0.5ML Pen INJECT 7.5 MG SUBCUTANEOUSLY WEEKLY Patient not taking: Reported on 09/29/2022 08/31/22   Tessa Lerner, DO    Current Outpatient Medications  Medication Sig Dispense Refill   aspirin (ASPIRIN CHILDRENS) 81 MG chewable tablet Chew 1 tablet (81 mg total) by mouth daily.     metFORMIN (GLUCOPHAGE-XR) 500 MG 24 hr tablet TAKE 1 TABLET BY MOUTH TWICE A DAY 180 tablet 1   olmesartan (BENICAR) 20 MG tablet TAKE 1/2 TABLET BY MOUTH EVERY DAY 15 tablet 11   simvastatin (ZOCOR) 40 MG tablet TAKE 1 TABLET BY MOUTH EVERYDAY AT BEDTIME 90 tablet 3   tirzepatide (MOUNJARO) 7.5 MG/0.5ML Pen INJECT 7.5 MG SUBCUTANEOUSLY WEEKLY (Patient not taking: Reported on 09/29/2022) 6 mL 3   Current Facility-Administered Medications  Medication Dose Route Frequency Provider Last Rate Last Admin   0.9 %  sodium chloride infusion  500 mL Intravenous Once Tymere Depuy, Willaim Rayas, MD        Allergies as of 10/19/2022 - Review Complete 10/19/2022  Allergen Reaction Noted   Bee venom Swelling 07/09/2015    Family History  Problem Relation Age of Onset   Diabetes Father    Heart disease Father 2   Esophageal cancer Maternal Grandmother    Colon cancer Neg Hx  Colon polyps Neg Hx    Rectal cancer Neg Hx    Stomach cancer Neg Hx     Social History   Socioeconomic History   Marital status: Married    Spouse name: Not on file   Number of children: Not on file   Years of education: Not on file   Highest education level: Not on file  Occupational History   Not on file  Tobacco Use   Smoking status: Never   Smokeless tobacco: Never  Vaping Use   Vaping Use: Never used  Substance and Sexual Activity   Alcohol use: Yes    Comment: rarely   Drug use: No   Sexual activity: Yes    Birth  control/protection: None  Other Topics Concern   Not on file  Social History Narrative   Not on file   Social Determinants of Health   Financial Resource Strain: Not on file  Food Insecurity: Not on file  Transportation Needs: Not on file  Physical Activity: Not on file  Stress: Not on file  Social Connections: Not on file  Intimate Partner Violence: Not on file    Review of Systems: All other review of systems negative except as mentioned in the HPI.  Physical Exam: Vital signs BP (!) 144/75   Pulse 60   Temp 97.8 F (36.6 C)   Ht 5\' 10"  (1.778 m)   Wt 287 lb (130.2 kg)   SpO2 98%   BMI 41.18 kg/m   General:   Alert,  Well-developed, pleasant and cooperative in NAD Lungs:  Clear throughout to auscultation.   Heart:  Regular rate and rhythm Abdomen:  Soft, nontender and nondistended.   Neuro/Psych:  Alert and cooperative. Normal mood and affect. A and O x 3  Harlin Rain, MD [facility]

## 2022-10-19 NOTE — Op Note (Signed)
[facility] Patient Name: Jason Waters Procedure Date: 10/19/2022 10:23 AM MRN: [id number] Endoscopist: Viviann Spare P. Adela Lank , MD, [id number] Age: 52 Referring MD:  Date of Birth: 04-17-1971 Gender: Male Account #: [id number] Procedure:                Colonoscopy Indications:              High risk colon cancer surveillance: Personal                            history of colonic polyps - 6 polyps removed 08/2019 Medicines:                Monitored Anesthesia Care Procedure:                Pre-Anesthesia Assessment:                           - Prior to the procedure, a History and Physical                            was performed, and patient medications and                            allergies were reviewed. The patient's tolerance of                            previous anesthesia was also reviewed. The risks                            and benefits of the procedure and the sedation                            options and risks were discussed with the patient.                            All questions were answered, and informed consent                            was obtained. Prior Anticoagulants: The patient has                            taken no anticoagulant or antiplatelet agents. ASA                            Grade Assessment: III - A patient with severe                            systemic disease. After reviewing the risks and                            benefits, the patient was deemed in satisfactory                            condition to undergo the procedure.  After obtaining informed consent, the colonoscope                            was passed under direct vision. Throughout the                            procedure, the patient's blood pressure, pulse, and                            oxygen saturations were monitored continuously. The                            CF [id number] #[id number] was introduced through the anus                             and advanced to the the cecum, identified by                            appendiceal orifice and ileocecal valve. The                            colonoscopy was performed without difficulty. The                            patient tolerated the procedure well. The quality                            of the bowel preparation was good. The ileocecal                            valve, appendiceal orifice, and rectum were                            photographed. Scope In: 10:41:24 AM Scope Out: 10:56:05 AM Scope Withdrawal Time: 0 hours 12 minutes 26 seconds  Total Procedure Duration: 0 hours 14 minutes 41 seconds  Findings:                 The perianal and digital rectal examinations were                            normal.                           An area of altered mucosa was found at the                            ileocecal valve. This could be normal variant /                            reactive, but biopsies were taken with a cold                            forceps for histology to ensure no flat adenoma.  A 3 mm polyp was found in the ascending colon. The                            polyp was sessile. The polyp was removed with a                            cold snare. Resection and retrieval were complete.                           A single small angiodysplastic lesion was found in                            the ascending colon.                           A 3 mm polyp was found in the transverse colon. The                            polyp was sessile. The polyp was removed with a                            cold snare. Resection and retrieval were complete.                           Internal hemorrhoids were found during retroflexion.                           The exam was otherwise without abnormality. Complications:            No immediate complications. Estimated blood loss:                            Minimal. Estimated Blood Loss:     Estimated blood loss was  minimal. Impression:               - Altered mucosa at the ileocecal valve. Suspect                            normal variant but biopsied to ensure no flat                            adenoma.                           - One 3 mm polyp in the ascending colon, removed                            with a cold snare. Resected and retrieved.                           - A single colonic angiodysplastic lesion.                           - One 3 mm polyp in the transverse colon, removed  with a cold snare. Resected and retrieved.                           - Internal hemorrhoids.                           - The examination was otherwise normal. Recommendation:           - Patient has a contact number available for                            emergencies. The signs and symptoms of potential                            delayed complications were discussed with the                            patient. Return to normal activities tomorrow.                            Written discharge instructions were provided to the                            patient.                           - Resume previous diet.                           - Continue present medications.                           - Await pathology results. Viviann Spare P. Meriem Lemieux, MD 10/19/2022 11:01:32 AM This report has been signed electronically.

## 2022-10-19 NOTE — Progress Notes (Signed)
Pt's states no medical or surgical changes since previsit or office visit. 

## 2022-10-20 ENCOUNTER — Telehealth: Payer: Self-pay | Admitting: *Deleted

## 2022-10-20 NOTE — Telephone Encounter (Signed)
  Follow up Call-    Row Labels 10/19/2022   10:02 AM  Call back number   Section Header. No data exists in this row.   Post procedure Call Back phone  #   [phone]  Permission to leave phone message   Yes     Patient questions:  Do you have a fever, pain , or abdominal swelling? No. Pain Score  0 *  Have you tolerated food without any problems? Yes.    Have you been able to return to your normal activities? Yes.    Do you have any questions about your discharge instructions: Diet   No. Medications  No. Follow up visit  No.  Do you have questions or concerns about your Care? No.  Actions: * If pain score is 4 or above: No action needed, pain <4.

## 2022-11-01 ENCOUNTER — Encounter: Payer: Self-pay | Admitting: Cardiology

## 2022-11-01 ENCOUNTER — Ambulatory Visit: Payer: 59 | Admitting: Cardiology

## 2022-11-01 ENCOUNTER — Other Ambulatory Visit: Payer: Self-pay | Admitting: Cardiology

## 2022-11-01 VITALS — BP 117/72 | HR 59 | Ht 70.0 in | Wt 290.0 lb

## 2022-11-01 DIAGNOSIS — E119 Type 2 diabetes mellitus without complications: Secondary | ICD-10-CM

## 2022-11-01 DIAGNOSIS — I10 Essential (primary) hypertension: Secondary | ICD-10-CM

## 2022-11-01 DIAGNOSIS — E7800 Pure hypercholesterolemia, unspecified: Secondary | ICD-10-CM

## 2022-11-01 DIAGNOSIS — R931 Abnormal findings on diagnostic imaging of heart and coronary circulation: Secondary | ICD-10-CM

## 2022-11-01 MED ORDER — MOUNJARO 10 MG/0.5ML ~~LOC~~ SOAJ
10.0000 mg | SUBCUTANEOUS | 1 refills | Status: DC
Start: 2022-11-01 — End: 2022-11-01

## 2022-11-01 NOTE — Progress Notes (Signed)
Primary Physician/Referring:  Jason Sax, MD  Patient ID: Jason Waters, male    DOB: [birth year], 52 y.o.   MRN: [id number]  Chief Complaint  Patient presents with   Coronary Artery Disease   Follow-up   HPI:    Jason Waters  is a 52 y.o. Male patient with diabetes mellitus, hypertension, hypercholesterolemia, morbid obesity referred to me for evaluation of chest pain.  Patient was seen for chest pain in March 2023 and again in January 2024, he was ruled out for myocardial infarction and discharged home.  Patient describes his chest pain as sharp pains that come across his chest sometimes and sometimes in the left side of the chest lasting a few seconds, sometimes can last for hours.  He has had evaluation for the same pain year ago in the emergency room and again recently in January 2024.  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  This does not stop him from doing physical activity.  No change in symptomatology. Otherwise denies any dyspnea, leg edema, PND or orthopnea.  Does endorse loud snoring and daytime somnolence.  No syncope.  Past Medical History:  Diagnosis Date   Diabetes mellitus without complication (HCC)    Hyperlipidemia    Hyperplastic rectal polyp    Hypertension    Medical history non-contributory    Neuromuscular disorder (HCC)    left arm goes to sleep frequently    Perirectal fistula    Past Surgical History:  Procedure Laterality Date   HAND SURGERY Left    LUMBAR LAMINECTOMY/DECOMPRESSION MICRODISCECTOMY Right 07/09/2015   Procedure: RIGHT SIDED LUMBAR 5-SACRUM 1 MICRODISECTOMY;  Surgeon: Estill Bamberg, MD;  Location: [facility] OR;  Service: Orthopedics;  Laterality: Right;  Right sided lumbar 5-sacrum 1 microdisectomy   POLYPECTOMY  02/2019   HPP rectal    RECTAL EXAM UNDER ANESTHESIA N/A 03/15/2019   Procedure: ANORECTAL EXAM  UNDER ANESTHESIA, REPAIR OF PERIRECTAL FISTULA, POSSIBLE HEMORROIDECTOMY;  Surgeon: Karie Soda, MD;  Location: [facility];  Service: General;  Laterality: N/A;   WISDOM TOOTH EXTRACTION     Family History  Problem Relation Age of Onset   Diabetes Father    Heart disease Father 59   Esophageal cancer Maternal Grandmother    Colon cancer Neg Hx    Colon polyps Neg Hx    Rectal cancer Neg Hx    Stomach cancer Neg Hx     Social History   Tobacco Use   Smoking status: Never   Smokeless tobacco: Never  Substance Use Topics   Alcohol use: Yes    Comment: rarely   Marital Status: Married  ROS  Review of Systems  Cardiovascular:  Positive for chest pain.  Respiratory:  Positive for snoring.    Objective      11/01/2022   11:05 AM 10/19/2022   11:20 AM 10/19/2022   11:10 AM  Vitals with BMI  Height 5\' 10"     Weight 290 lbs    BMI 41.61    Systolic 117 132 409  Diastolic 72 66 60  Pulse 59 57 58   SpO2: 98 %  Physical Exam Constitutional:      Appearance: He is morbidly obese.  Neck:     Vascular: No carotid bruit or JVD.  Cardiovascular:     Rate and Rhythm: Normal rate and regular rhythm.     Pulses: Intact  distal pulses.     Heart sounds: Normal heart sounds. No murmur heard.    No gallop.  Pulmonary:     Effort: Pulmonary effort is normal.     Breath sounds: Normal breath sounds.  Abdominal:     General: Bowel sounds are normal.     Palpations: Abdomen is soft.  Musculoskeletal:     Right lower leg: No edema.     Left lower leg: No edema.    Laboratory examination:   Recent Labs    12/01/21 0921 06/06/22 0643  NA 138 140  K 4.2 4.0  CL 104 103  CO2 26 29  GLUCOSE 102* 146*  BUN 19 17  CREATININE 0.94 1.15  CALCIUM 9.1 9.8  GFRNONAA  --  >60    Lab Results  Component Value Date   GLUCOSE 146 (H) 06/06/2022   NA 140 06/06/2022   K 4.0 06/06/2022   CL 103 06/06/2022   CO2 29 06/06/2022   BUN 17 06/06/2022   CREATININE 1.15 06/06/2022    GFRNONAA >60 06/06/2022   CALCIUM 9.8 06/06/2022   PROT 7.1 09/30/2021   ALBUMIN 4.5 09/30/2021   BILITOT 0.8 09/30/2021   ALKPHOS 64 09/30/2021   AST 20 09/30/2021   ALT 24 09/30/2021   ANIONGAP 8 06/06/2022      Lab Results  Component Value Date   ALT 24 09/30/2021   AST 20 09/30/2021   ALKPHOS 64 09/30/2021   BILITOT 0.8 09/30/2021       Latest Ref Rng & Units 09/30/2021    8:55 AM 04/02/2021    2:08 PM 01/04/2020    4:36 PM  Hepatic Function  Total Protein 6.0 - 8.3 g/dL 7.1  6.8  6.7   Albumin 3.5 - 5.2 g/dL 4.5  4.5    AST 0 - 37 U/L 20  20  31    ALT 0 - 53 U/L 24  35  55   Alk Phosphatase 39 - 117 U/L 64  71    Total Bilirubin 0.2 - 1.2 mg/dL 0.8  0.8  0.6    Lab Results  Component Value Date   CHOL 120 09/30/2021   HDL 40.40 09/30/2021   LDLCALC 54 09/30/2021   LDLDIRECT 77.0 04/02/2021   TRIG 128.0 09/30/2021   CHOLHDL 3 09/30/2021    HEMOGLOBIN A1C Lab Results  Component Value Date   HGBA1C 5.4 09/30/2021   MPG 186 01/04/2020   Lab Results  Component Value Date   TSH 2.84 03/22/2018    Radiology:    Cardiac Studies:   Coronary calcium score 07/12/2022 - Total Calcium Score: 210.  -- Left Main: 0.  -- Left Anterior Descending: 198.  -- Circumflex: 2.  -- Right Coronary: 10  1.  Total calcium score of 210 is between the 75th and 90th percentile for males between the ages of 78 and 6 based on the [facility].  2.  Incidental 5 mm subpleural solid pulmonary nodule within the right lower lobe. If the patient has risk factors for lung cancer, consider a follow-up chest CT in one year to ensure stability.   PCV ECHOCARDIOGRAM COMPLETE 06/28/2022  Narrative Echocardiogram 06/28/2022: Normal LV systolic function with visual EF 55-60%. Left ventricle cavity is normal in size. Mild concentric hypertrophy of the left ventricle. Normal global wall motion. Normal diastolic filling pattern, normal LAP. Calculated EF 66%. Structurally normal tricuspid  valve with trace regurgitation. No evidence of pulmonary hypertension. No prior available for comparison.  PCV  MYOCARDIAL PERFUSION WO LEXISCAN 08/23/2022  Narrative Exercise nuclear stress test 08/23/2022 Myocardial perfusion is normal. Overall LV systolic function is normal without regional wall motion abnormalities. Stress LV EF is normal 60%. Low risk study. Normal ECG stress. The patient exercised for 9 minutes and 0 seconds of a Bruce protocol, achieving approximately 11.68 METs and 88% MPHR. Peak EKG/ECG demonstrated sinus tachycardia. The heart rate response was normal. The blood pressure response was normal. No previous exam available for comparison.      EKG:   EKG 06/16/2022: Normal sinus rhythm at the rate of 80 bpm, normal EKG.      Medications and allergies   Allergies  Allergen Reactions   Bee Venom Swelling    Medication list    Current Outpatient Medications:    aspirin (ASPIRIN CHILDRENS) 81 MG chewable tablet, Chew 1 tablet (81 mg total) by mouth daily., Disp: , Rfl:    metFORMIN (GLUCOPHAGE-XR) 500 MG 24 hr tablet, TAKE 1 TABLET BY MOUTH TWICE A DAY, Disp: 180 tablet, Rfl: 1   olmesartan (BENICAR) 20 MG tablet, TAKE 1/2 TABLET BY MOUTH EVERY DAY, Disp: 15 tablet, Rfl: 11   simvastatin (ZOCOR) 40 MG tablet, TAKE 1 TABLET BY MOUTH EVERYDAY AT BEDTIME, Disp: 90 tablet, Rfl: 3   tirzepatide (MOUNJARO) 10 MG/0.5ML Pen, Inject 10 mg into the skin once a week., Disp: 6 mL, Rfl: 1   Assessment     ICD-10-CM   1. Elevated coronary artery calcium score 07/12/2022: Total calcium scoref 210 between the 75th and 90th percentile  R93.1 tirzepatide (MOUNJARO) 10 MG/0.5ML Pen    2. Primary hypertension  I10     3. Class 3 severe obesity due to excess calories with serious comorbidity and body mass index (BMI) of 40.0 to 44.9 in adult (HCC)  E66.01    Z68.41     4. Pure hypercholesterolemia  E78.00     5. Type 2 diabetes mellitus without complication, without  long-term current use of insulin (HCC)  E11.9 tirzepatide (MOUNJARO) 10 MG/0.5ML Pen      No orders of the defined types were placed in this encounter.  Meds ordered this encounter  Medications   tirzepatide (MOUNJARO) 10 MG/0.5ML Pen    Sig: Inject 10 mg into the skin once a week.    Dispense:  6 mL    Refill:  1   Medications Discontinued During This Encounter  Medication Reason   tirzepatide (MOUNJARO) 7.5 MG/0.5ML Pen Dose change     Recommendations:   Jason Waters is a 51 y.o. Male patient with diabetes mellitus, hypertension, hypercholesterolemia, morbid obesity ED visit and seen for chest pain in March 2023 and again in January 2024, he was ruled out for myocardial infarction and discharged home.  He now presents for 71-month office visit, I started him on Mounjaro for weight loss as well.  1. Elevated coronary artery calcium score 07/12/2022: Total calcium scoref 210 between the 75th and 90th percentile His tests were again reviewed.  Patient has elevated coronary calcium score the 90th percentile.  Aggressive risk factor modification is indicated.  Weight loss, diet, lipid control and hypertension control discussed with the patient.  He has lost about 10 pounds in weight on Mounjaro however he has not for a month due to short supply.  I have resent the prescription for 10 mg q. weekly.  Reviewed echocardiogram and also nuclear stress test revealing normal LVEF and no evidence of ischemia by stress test, low risk.  2. Primary  hypertension Blood pressure is well-controlled, presently on ARB, continue the same.  3. Class 3 severe obesity due to excess calories with serious comorbidity and body mass index (BMI) of 40.0 to 44.9 in adult (HCC) Class III obesity, hopefully he will continue to lose weight he has lost about 10 pounds since being on Mounjaro.  4. Pure hypercholesterolemia Lipids are well-controlled on simvastatin.  External labs reviewed.  5. Type 2 diabetes  mellitus without complication, without long-term current use of insulin (HCC) Diabetes is improved to prediabetes range, continue Mounjaro, I like to see him back in 6 months for follow-up.  He will contact me if he has difficulty obtaining HR.    Jason Decamp, MD, [facility] 11/01/2022, 12:16 PM Office: [phone]

## 2022-11-02 ENCOUNTER — Other Ambulatory Visit: Payer: Self-pay

## 2022-11-02 DIAGNOSIS — R931 Abnormal findings on diagnostic imaging of heart and coronary circulation: Secondary | ICD-10-CM

## 2022-11-02 DIAGNOSIS — E119 Type 2 diabetes mellitus without complications: Secondary | ICD-10-CM

## 2022-11-02 MED ORDER — MOUNJARO 10 MG/0.5ML ~~LOC~~ SOAJ
SUBCUTANEOUS | 1 refills | Status: DC
Start: 2022-11-02 — End: 2022-12-07

## 2022-11-06 ENCOUNTER — Other Ambulatory Visit: Payer: Self-pay | Admitting: Family Medicine

## 2022-11-06 DIAGNOSIS — E119 Type 2 diabetes mellitus without complications: Secondary | ICD-10-CM

## 2022-12-05 ENCOUNTER — Other Ambulatory Visit: Payer: Self-pay | Admitting: Family Medicine

## 2022-12-05 DIAGNOSIS — E119 Type 2 diabetes mellitus without complications: Secondary | ICD-10-CM

## 2022-12-06 ENCOUNTER — Other Ambulatory Visit: Payer: Self-pay | Admitting: Cardiology

## 2022-12-06 DIAGNOSIS — R931 Abnormal findings on diagnostic imaging of heart and coronary circulation: Secondary | ICD-10-CM

## 2022-12-06 DIAGNOSIS — E119 Type 2 diabetes mellitus without complications: Secondary | ICD-10-CM

## 2023-01-03 ENCOUNTER — Other Ambulatory Visit: Payer: Self-pay | Admitting: Family Medicine

## 2023-01-03 DIAGNOSIS — E119 Type 2 diabetes mellitus without complications: Secondary | ICD-10-CM

## 2023-01-07 ENCOUNTER — Encounter: Payer: Self-pay | Admitting: Family Medicine

## 2023-01-07 ENCOUNTER — Ambulatory Visit: Payer: 59 | Admitting: Family Medicine

## 2023-01-07 VITALS — BP 128/70 | HR 74 | Ht 70.0 in | Wt 281.6 lb

## 2023-01-07 DIAGNOSIS — Z125 Encounter for screening for malignant neoplasm of prostate: Secondary | ICD-10-CM | POA: Diagnosis not present

## 2023-01-07 DIAGNOSIS — E7800 Pure hypercholesterolemia, unspecified: Secondary | ICD-10-CM

## 2023-01-07 DIAGNOSIS — Z0000 Encounter for general adult medical examination without abnormal findings: Secondary | ICD-10-CM

## 2023-01-07 DIAGNOSIS — Z23 Encounter for immunization: Secondary | ICD-10-CM

## 2023-01-07 DIAGNOSIS — E119 Type 2 diabetes mellitus without complications: Secondary | ICD-10-CM

## 2023-01-07 DIAGNOSIS — E559 Vitamin D deficiency, unspecified: Secondary | ICD-10-CM | POA: Diagnosis not present

## 2023-01-07 DIAGNOSIS — Z6841 Body Mass Index (BMI) 40.0 and over, adult: Secondary | ICD-10-CM | POA: Diagnosis not present

## 2023-01-07 DIAGNOSIS — Z5320 Procedure and treatment not carried out because of patient's decision for unspecified reasons: Secondary | ICD-10-CM

## 2023-01-07 LAB — LIPID PANEL
Cholesterol: 110 mg/dL (ref 0–200)
HDL: 30.9 mg/dL — ABNORMAL LOW (ref >39.00)
LDL Cholesterol: 55 mg/dL (ref 0–99)
NonHDL: 78.98
Total CHOL/HDL Ratio: 4
Triglycerides: 122 mg/dL (ref 0.0–149.0)
VLDL: 24.4 mg/dL (ref 0.0–40.0)

## 2023-01-07 LAB — COMPREHENSIVE METABOLIC PANEL
ALT: 26 U/L (ref 0–53)
AST: 19 U/L (ref 0–37)
Albumin: 4.3 g/dL (ref 3.5–5.2)
Alkaline Phosphatase: 62 U/L (ref 39–117)
BUN: 17 mg/dL (ref 6–23)
CO2: 27 mEq/L (ref 19–32)
Calcium: 9.2 mg/dL (ref 8.4–10.5)
Chloride: 104 mEq/L (ref 96–112)
Creatinine, Ser: 0.86 mg/dL (ref 0.40–1.50)
GFR: 99.85 mL/min (ref >60.00)
Glucose, Bld: 83 mg/dL (ref 70–99)
Potassium: 3.9 mEq/L (ref 3.5–5.1)
Sodium: 139 mEq/L (ref 135–145)
Total Bilirubin: 0.7 mg/dL (ref 0.2–1.2)
Total Protein: 6.6 g/dL (ref 6.0–8.3)

## 2023-01-07 LAB — URINALYSIS, ROUTINE W REFLEX MICROSCOPIC
Bilirubin Urine: NEGATIVE
Hgb urine dipstick: NEGATIVE
Ketones, ur: NEGATIVE
Leukocytes,Ua: NEGATIVE
Nitrite: NEGATIVE
RBC / HPF: NONE SEEN (ref 0–?)
Specific Gravity, Urine: 1.02 (ref 1.000–1.030)
Total Protein, Urine: NEGATIVE
Urine Glucose: NEGATIVE
Urobilinogen, UA: 0.2 (ref 0.0–1.0)
WBC, UA: NONE SEEN (ref 0–?)
pH: 6 (ref 5.0–8.0)

## 2023-01-07 LAB — MICROALBUMIN / CREATININE URINE RATIO
Creatinine,U: 98.7 mg/dL
Microalb Creat Ratio: 2 mg/g (ref 0.0–30.0)
Microalb, Ur: 2 mg/dL — ABNORMAL HIGH (ref 0.0–1.9)

## 2023-01-07 LAB — CBC
HCT: 43.2 % (ref 39.0–52.0)
Hemoglobin: 14.3 g/dL (ref 13.0–17.0)
MCHC: 33.1 g/dL (ref 30.0–36.0)
MCV: 84.4 fl (ref 78.0–100.0)
Platelets: 289 10*3/uL (ref 150.0–400.0)
RBC: 5.12 Mil/uL (ref 4.22–5.81)
RDW: 13.3 % (ref 11.5–15.5)
WBC: 10.2 10*3/uL (ref 4.0–10.5)

## 2023-01-07 LAB — HEMOGLOBIN A1C: Hgb A1c MFr Bld: 5.6 % (ref 4.6–6.5)

## 2023-01-07 LAB — PSA: PSA: 1.88 ng/mL (ref 0.10–4.00)

## 2023-01-07 LAB — VITAMIN D 25 HYDROXY (VIT D DEFICIENCY, FRACTURES): VITD: 32.42 ng/mL (ref 30.00–100.00)

## 2023-01-07 NOTE — Progress Notes (Signed)
Established Patient Office Visit   Subjective:  Patient ID: Jason Waters, male    DOB: 01/09/1971  Age: 52 y.o. MRN: [id number]  No chief complaint on file.   HPI Encounter Diagnoses  Name Primary?   Healthcare maintenance Yes   Morbid obesity (HCC)    Controlled type 2 diabetes mellitus without complication, without long-term current use of insulin (HCC)    Elevated LDL cholesterol level    Vitamin D deficiency    Immunization due    Screening for prostate cancer    Screening for hepatitis C declined    Morbid obesity with BMI of 40.0-44.9, adult (HCC)    Here for physical exam and follow-up of above.  Recent follow-up with cardiology, tirzepatide was increased to 10 mg weekly.  Otherwise medications are the same.  Due for Tdap.  Blood pressure well-controlled with olmesartan.  Diabetes well-controlled with tirzepatide and metformin.  Continue simvastatin for elevated cholesterol.  He is active at his job.  He has regular dental care.   Review of Systems  Constitutional: Negative.   HENT: Negative.    Eyes:  Negative for blurred vision, discharge and redness.  Respiratory: Negative.    Cardiovascular: Negative.   Gastrointestinal:  Negative for abdominal pain.  Genitourinary: Negative.   Musculoskeletal: Negative.  Negative for myalgias.  Skin:  Negative for rash.  Neurological:  Negative for tingling, loss of consciousness and weakness.  Endo/Heme/Allergies:  Negative for polydipsia.     Current Outpatient Medications:    aspirin (ASPIRIN CHILDRENS) 81 MG chewable tablet, Chew 1 tablet (81 mg total) by mouth daily., Disp: , Rfl:    metFORMIN (GLUCOPHAGE-XR) 500 MG 24 hr tablet, TAKE 1 TABLET BY MOUTH TWICE A DAY, Disp: 60 tablet, Rfl: 1   olmesartan (BENICAR) 20 MG tablet, TAKE 1/2 TABLET BY MOUTH EVERY DAY, Disp: 15 tablet, Rfl: 11   simvastatin (ZOCOR) 40 MG tablet, TAKE 1 TABLET BY MOUTH EVERYDAY AT BEDTIME, Disp: 90 tablet, Rfl: 3   tirzepatide (MOUNJARO) 10  MG/0.5ML Pen, INJECT 10 MG INTO THE SKIN ONE TIME PER WEEK, Disp: 6 mL, Rfl: 1   Objective:     BP 128/70   Pulse 74   Ht 5\' 10"  (1.778 m)   Wt 281 lb 9.6 oz (127.7 kg)   SpO2 95%   BMI 40.41 kg/m  Wt Readings from Last 3 Encounters:  01/07/23 281 lb 9.6 oz (127.7 kg)  11/01/22 290 lb (131.5 kg)  10/19/22 287 lb (130.2 kg)      Physical Exam Constitutional:      General: He is not in acute distress.    Appearance: Normal appearance. He is not ill-appearing, toxic-appearing or diaphoretic.  HENT:     Head: Normocephalic and atraumatic.     Right Ear: External ear normal.     Left Ear: External ear normal.     Mouth/Throat:     Mouth: Mucous membranes are moist.     Pharynx: Oropharynx is clear. No oropharyngeal exudate or posterior oropharyngeal erythema.  Eyes:     General: No scleral icterus.       Right eye: No discharge.        Left eye: No discharge.     Extraocular Movements: Extraocular movements intact.     Conjunctiva/sclera: Conjunctivae normal.     Pupils: Pupils are equal, round, and reactive to light.  Cardiovascular:     Rate and Rhythm: Normal rate and regular rhythm.  Pulmonary:     Effort:  Pulmonary effort is normal. No respiratory distress.     Breath sounds: Normal breath sounds.  Abdominal:     General: Bowel sounds are normal.     Tenderness: There is no abdominal tenderness. There is no guarding or rebound.     Hernia: No hernia is present. There is no hernia in the left inguinal area or right inguinal area.  Genitourinary:    Penis: No hypospadias, erythema, tenderness, swelling or lesions.      Testes:        Right: Mass, tenderness or swelling not present. Right testis is descended.        Left: Mass, tenderness or swelling not present. Left testis is descended.     Epididymis:     Right: Not inflamed or enlarged.     Left: Not inflamed or enlarged.  Musculoskeletal:     Cervical back: No rigidity or tenderness.  Lymphadenopathy:      Lower Body: No right inguinal adenopathy. No left inguinal adenopathy.  Skin:    General: Skin is warm and dry.  Neurological:     Mental Status: He is alert and oriented to person, place, and time.  Psychiatric:        Mood and Affect: Mood normal.        Behavior: Behavior normal.      No results found for any visits on 01/07/23.    The ASCVD Risk score (Arnett DK, et al., 2019) failed to calculate for the following reasons:   The valid total cholesterol range is 130 to 320 mg/dL    Assessment & Plan:   Healthcare maintenance -     CBC  Morbid obesity (HCC)  Controlled type 2 diabetes mellitus without complication, without long-term current use of insulin (HCC) -     Comprehensive metabolic panel -     Hemoglobin A1c -     Urinalysis, Routine w reflex microscopic -     Microalbumin / creatinine urine ratio -     Ambulatory referral to Ophthalmology  Elevated LDL cholesterol level -     Comprehensive metabolic panel -     Lipid panel  Vitamin D deficiency -     VITAMIN D 25 Hydroxy (Vit-D Deficiency, Fractures)  Immunization due -     Tdap vaccine greater than or equal to 7yo IM  Screening for prostate cancer -     PSA  Screening for hepatitis C declined  Morbid obesity with BMI of 40.0-44.9, adult (HCC)    Return in about 6 months (around 07/10/2023).  Information was given on health maintenance and disease prevention.  Mostly up-to-date on health maintenance.  Needs an eye exam.  Information was given on calorie counting to lose weight.  Discussed  precautions with a GLP-1 agonist medications to include severe abdominal pain that could indicate intestinal obstruction or pancreatitis.  Advised to follow-up with me or proceed to the emergency room.  Tdap today.  Mliss Sax, MD

## 2023-01-25 ENCOUNTER — Other Ambulatory Visit: Payer: Self-pay | Admitting: Family Medicine

## 2023-01-25 DIAGNOSIS — E119 Type 2 diabetes mellitus without complications: Secondary | ICD-10-CM

## 2023-02-08 ENCOUNTER — Telehealth: Payer: Self-pay | Admitting: Family Medicine

## 2023-02-08 NOTE — Telephone Encounter (Signed)
Patient dropped off document  phys result form , to be filled out by provider. Patient requested to send it back via Call Patient to pick up within 5-days. Document is located in providers tray at front office.Please advise at Mobile [phone] (mobile)   Pt walked in for paperwork to be filled out. Pt in dr box

## 2023-02-10 ENCOUNTER — Encounter: Payer: Self-pay | Admitting: Cardiology

## 2023-02-10 NOTE — Telephone Encounter (Signed)
Can you please refer him to pharmacy to up titrate [facility] please and let me know

## 2023-02-10 NOTE — Telephone Encounter (Signed)
Form placed in provider folder to sign in his office. Dm/cma

## 2023-02-11 MED ORDER — MOUNJARO 12.5 MG/0.5ML ~~LOC~~ SOAJ: 12.5000 mg | SUBCUTANEOUS | 0 refills | Status: DC

## 2023-02-11 NOTE — Telephone Encounter (Signed)
Spoke to patient, does not check BG at home but A1c 1 month ago improved to 5.6. Gained 5 lbs. Was 281 lbs in Aug, 2024 and current weight 285.8.   Will titrate dose to  Mounjaro 12.5 mg and follow up in 4 weeks to titrate dose further to max dose - 15 mg once week.

## 2023-02-17 NOTE — Telephone Encounter (Signed)
Pt called to ask why he has not received the form be filled out for his job He stated he was told 5 business days and it has been 9. He is asking if it can be faxed to the # on the form and also a hard copy he can pick up in front office. He would like a call to let him know when up front.  Jason Waters [phone]

## 2023-03-05 ENCOUNTER — Other Ambulatory Visit: Payer: Self-pay | Admitting: Cardiology

## 2023-03-05 ENCOUNTER — Other Ambulatory Visit: Payer: Self-pay | Admitting: Family Medicine

## 2023-03-05 DIAGNOSIS — M1990 Unspecified osteoarthritis, unspecified site: Secondary | ICD-10-CM

## 2023-03-07 ENCOUNTER — Telehealth: Payer: Self-pay | Admitting: Pharmacist

## 2023-03-07 NOTE — Telephone Encounter (Signed)
Received refill request for Mounjaro 12.5mg . Called pt to follow up with tolerability and dose increase. HE reports tolerating med well, no side effects, 1 low sugar reading (hadn't checked it but felt like it was low). He prefers to stay on the 12.5mg  dose. Refill sent in. Advised him to call our office if he wishes to try the 15mg  dose in the future.

## 2023-05-01 NOTE — Progress Notes (Unsigned)
Cardiology Office Note:  .   Date:  05/02/2023  ID:  Jason Waters, DOB [birth year], MRN [id number] PCP: Yates Decamp, MD  [facility] Providers Cardiologist:  None {  History of Present Illness: .   Jason Waters is a 52 y.o. male with a past medical history of diabetes mellitus, hypertension, hyperlipidemia, morbid obesity referred for evaluation of chest pain.  Patient was seen for chest pain March 2023 and again in January 2024.  Most recently seen in June 2020 for.  Ruled out for MI prior to that visit.  In June, patient described chest pain as sharp that comes across his chest sometimes on the left side lasting a few seconds and can last up to 8 hours.  He had an patient for the same pain a year ago in the emergency room and again recently in July 2024.  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  This does not stop him from doing his physical activity.  No change in symptomology.  Otherwise, denies dyspnea, leg edema, PND, orthopnea.  Does endorse loud snoring and daytime somnolence.  No syncope.  Today, he presents with a history of elevated calcium score and hyperlipidemia, with left-sided chest tightness. He describes the sensation as more of a cramping feeling, located under the armpit on the side. The patient is unsure if the discomfort is due to cardiac issues or muscle cramping related to his work. He reports that he has been drinking plenty of water to prevent cramping in his legs, which he has also experienced. The patient has been managing his hyperlipidemia with simvastatin, which has successfully lowered his LDL cholesterol to 55. He also has diabetes, which is well-controlled with metformin and Mounjaro, with a recent A1c of 5.6. The patient reports that he can eat what he wants without significantly affecting his blood sugar levels. He also reports  experiencing symptoms of low blood sugar more often than high blood sugar. The patient works long hours and has a demanding schedule, which he believes may be contributing to his symptoms. He reports feeling tired and needing to catch up on sleep during his off weeks. The patient also reports snoring and feeling sleepy during the day, particularly during car rides.  Reports no shortness of breath nor dyspnea on exertion. Reports no chest pain, pressure, or tightness. No edema, orthopnea, PND. Reports no palpitations.   Discussed the use of AI scribe software for clinical note transcription with the patient, who gave verbal consent to proceed.  ROS: Pertinent ROS in HPI  Studies Reviewed: Marland Kitchen   EKG Interpretation Date/Time:  Monday May 02 2023 10:09:14 EST Ventricular Rate:  85 PR Interval:  152 QRS Duration:  80 QT Interval:  348 QTC Calculation: 414 R Axis:   59  Text Interpretation: Normal sinus rhythm Normal ECG When compared with ECG of 06-Jun-2022 06:31, PREVIOUS ECG IS PRESENT Confirmed by Jari Favre [phone]) on 05/02/2023 10:17:37 AM    Coronary calcium score 07/12/2022 - Total Calcium Score: 210.  -- Left Main: 0.  -- Left Anterior Descending: 198.  -- Circumflex: 2.  -- Right Coronary: 10  1.  Total calcium score of 210 is between the 75th and 90th percentile for males between the ages of 69 and 40 based on the [facility] database.  2.  Incidental 5 mm subpleural solid pulmonary nodule within the right lower  lobe. If the patient has risk factors for lung cancer, consider a follow-up chest CT in one year to ensure stability.    PCV ECHOCARDIOGRAM COMPLETE 06/28/2022   Narrative Echocardiogram 06/28/2022: Normal LV systolic function with visual EF 55-60%. Left ventricle cavity is normal in size. Mild concentric hypertrophy of the left ventricle. Normal global wall motion. Normal diastolic filling pattern, normal LAP. Calculated EF 66%. Structurally normal tricuspid valve with  trace regurgitation. No evidence of pulmonary hypertension. No prior available for comparison.   PCV MYOCARDIAL PERFUSION WO LEXISCAN 08/23/2022   Narrative Exercise nuclear stress test 08/23/2022 Myocardial perfusion is normal. Overall LV systolic function is normal without regional wall motion abnormalities. Stress LV EF is normal 60%. Low risk study. Normal ECG stress. The patient exercised for 9 minutes and 0 seconds of a Bruce protocol, achieving approximately 11.68 METs and 88% MPHR. Peak EKG/ECG demonstrated sinus tachycardia. The heart rate response was normal. The blood pressure response was normal. No previous exam available for comparison.       Physical Exam:   VS:  BP 120/70   Pulse 85   Ht 5\' 10"  (1.778 m)   Wt 289 lb 3.2 oz (131.2 kg)   SpO2 96%   BMI 41.50 kg/m    Wt Readings from Last 3 Encounters:  05/02/23 289 lb 3.2 oz (131.2 kg)  01/07/23 281 lb 9.6 oz (127.7 kg)  11/01/22 290 lb (131.5 kg)    GEN: Well nourished, well developed in no acute distress NECK: No JVD; No carotid bruits CARDIAC: RRR, no murmurs, rubs, gallops RESPIRATORY:  Clear to auscultation without rales, wheezing or rhonchi  ABDOMEN: Soft, non-tender, non-distended EXTREMITIES:  No edema; No deformity   ASSESSMENT AND PLAN: .    Chest Pain Cramping sensation under the left armpit, likely non-cardiac given the location and constant nature. Possible electrolyte imbalance due to heavy work and inadequate electrolyte intake. -Order electrolyte panel today. -Advise patient to supplement with an electrolyte drink like Gatorade.  Hyperlipidemia LDL and triglycerides well controlled on Simvastatin. -Continue Simvastatin.  Diabetes A1C 5.6, well controlled on Metformin and Mounjaro. -Continue Metformin and Mounjaro. -Discuss possible medication adjustment with primary care provider if hypoglycemic symptoms persist.  Weight Management Patient reports weight gain while on Mounjaro  12.5. -Increase Mounjaro to next dosage level.   Sleep Deprivation Patient reports inadequate sleep due to work schedule. -Advise patient to catch up on sleep during off week.  General Health Maintenance -Continue daily Aspirin for elevated calcium score. -Consider evaluation for sleep apnea.      Dispo: He can follow-up in 6 months  Signed, Sharlene Dory, PA-C

## 2023-05-02 ENCOUNTER — Ambulatory Visit: Payer: 59 | Attending: Cardiology | Admitting: Physician Assistant

## 2023-05-02 ENCOUNTER — Ambulatory Visit: Payer: Self-pay | Admitting: Cardiology

## 2023-05-02 ENCOUNTER — Encounter: Payer: Self-pay | Admitting: Physician Assistant

## 2023-05-02 VITALS — BP 120/70 | HR 85 | Ht 70.0 in | Wt 289.2 lb

## 2023-05-02 DIAGNOSIS — E7800 Pure hypercholesterolemia, unspecified: Secondary | ICD-10-CM | POA: Diagnosis not present

## 2023-05-02 DIAGNOSIS — R931 Abnormal findings on diagnostic imaging of heart and coronary circulation: Secondary | ICD-10-CM

## 2023-05-02 DIAGNOSIS — Z6841 Body Mass Index (BMI) 40.0 and over, adult: Secondary | ICD-10-CM

## 2023-05-02 DIAGNOSIS — E66813 Obesity, class 3: Secondary | ICD-10-CM

## 2023-05-02 DIAGNOSIS — E785 Hyperlipidemia, unspecified: Secondary | ICD-10-CM | POA: Diagnosis not present

## 2023-05-02 DIAGNOSIS — I10 Essential (primary) hypertension: Secondary | ICD-10-CM

## 2023-05-02 DIAGNOSIS — R0683 Snoring: Secondary | ICD-10-CM

## 2023-05-02 DIAGNOSIS — E119 Type 2 diabetes mellitus without complications: Secondary | ICD-10-CM

## 2023-05-02 DIAGNOSIS — G4710 Hypersomnia, unspecified: Secondary | ICD-10-CM

## 2023-05-02 MED ORDER — MOUNJARO 15 MG/0.5ML ~~LOC~~ SOAJ: 15.0000 mg | SUBCUTANEOUS | 11 refills | Status: AC

## 2023-05-02 NOTE — Patient Instructions (Addendum)
Medication Instructions:  Increase tirzepatide (Mounjaro) to 15 MG/0.5 ML once a week   *If you need a refill on your cardiac medications before your next appointment, please call your pharmacy*   Lab Work: BMET, MAGNESIUM - Today   If you have labs (blood work) drawn today and your tests are completely normal, you will receive your results only by: MyChart Message (if you have MyChart) OR A paper copy in the mail If you have any lab test that is abnormal or we need to change your treatment, we will call you to review the results.   Testing/Procedures: None ordered    Follow-Up: At [facility], you and your health needs are our priority.  As part of our continuing mission to provide you with exceptional heart care, we have created designated Provider Care Teams.  These Care Teams include your primary Cardiologist (physician) and Advanced Practice Providers (APPs -  Physician Assistants and Nurse Practitioners) who all work together to provide you with the care you need, when you need it.  We recommend signing up for the patient portal called "MyChart".  Sign up information is provided on this After Visit Summary.  MyChart is used to connect with patients for Virtual Visits (Telemedicine).  Patients are able to view lab/test results, encounter notes, upcoming appointments, etc.  Non-urgent messages can be sent to your provider as well.   To learn more about what you can do with MyChart, go to [email or web address].    Your next appointment:   6 month(s)  Provider:    Dr. Yates Decamp  Other Instructions

## 2023-05-03 LAB — BASIC METABOLIC PANEL
BUN/Creatinine Ratio: 17 (ref 9–20)
BUN: 16 mg/dL (ref 6–24)
CO2: 21 mmol/L (ref 20–29)
Calcium: 9.2 mg/dL (ref 8.7–10.2)
Chloride: 102 mmol/L (ref 96–106)
Creatinine, Ser: 0.94 mg/dL (ref 0.76–1.27)
Glucose: 95 mg/dL (ref 70–99)
Potassium: 4.7 mmol/L (ref 3.5–5.2)
Sodium: 140 mmol/L (ref 134–144)
eGFR: 98 mL/min/{1.73_m2} (ref >59)

## 2023-05-03 LAB — MAGNESIUM: Magnesium: 1.9 mg/dL (ref 1.6–2.3)

## 2023-05-17 ENCOUNTER — Ambulatory Visit: Payer: Self-pay | Admitting: Cardiology

## 2023-05-17 ENCOUNTER — Ambulatory Visit: Payer: 59 | Admitting: Family Medicine

## 2023-05-17 ENCOUNTER — Encounter: Payer: Self-pay | Admitting: Family Medicine

## 2023-05-17 VITALS — BP 116/76 | HR 79 | Temp 98.2°F | Ht 70.0 in | Wt 287.6 lb

## 2023-05-17 DIAGNOSIS — S46002A Unspecified injury of muscle(s) and tendon(s) of the rotator cuff of left shoulder, initial encounter: Secondary | ICD-10-CM

## 2023-05-17 DIAGNOSIS — S39012A Strain of muscle, fascia and tendon of lower back, initial encounter: Secondary | ICD-10-CM | POA: Diagnosis not present

## 2023-05-17 LAB — URINALYSIS, ROUTINE W REFLEX MICROSCOPIC
Bilirubin Urine: NEGATIVE
Hgb urine dipstick: NEGATIVE
Ketones, ur: NEGATIVE
Leukocytes,Ua: NEGATIVE
Nitrite: NEGATIVE
RBC / HPF: NONE SEEN (ref 0–?)
Specific Gravity, Urine: 1.025 (ref 1.000–1.030)
Total Protein, Urine: NEGATIVE
Urine Glucose: NEGATIVE
Urobilinogen, UA: 0.2 (ref 0.0–1.0)
pH: 6 (ref 5.0–8.0)

## 2023-05-17 MED ORDER — PREDNISONE 10 MG (21) PO TBPK
ORAL_TABLET | ORAL | 0 refills | Status: AC
Start: 2023-05-17 — End: ?

## 2023-05-17 MED ORDER — TRAMADOL HCL 50 MG PO TABS
ORAL_TABLET | ORAL | 0 refills | Status: AC
Start: 2023-05-17 — End: ?

## 2023-05-17 NOTE — Telephone Encounter (Signed)
  Chief Complaint: L flank pain Symptoms: throbbing pain Frequency: 2 days Pertinent Negatives: Patient denies NVD, difficulty urinating, fever Disposition: [] ED /[] Urgent Care (no appt availability in office) / [x] Appointment(In office/virtual)/ []  [facility]/ [] Home Care/ [] Refused Recommended Disposition /[] [place] [facility]/ []  Follow-up with PCP Additional Notes: Pt called stating he has L side flank pain that began yesterday. Describes pain as 5/10, constant and throbbing. Denies fever, NVD, difficulty urinating. Pt states he has not taken any OTC medication for pain relief. Pt does report he is on Monjaro and unsure if that has anything to do with his pain. Per protocol, pt to be evaluated within 24 hours. Pt request latest appt for today- latest available 12/31 @ 1200 but states he cannot accept that appt due to work obligations. Pt reports that he will go to urgent care for eval once he leaves work.   Copied from CRM [id number]. Topic: Clinical - Red Word Triage >> May 17, 2023  7:36 AM Eleanor C wrote: Red Word that prompted transfer to Nurse Triage: patient having pain in lower back, thinks it could be kidney stones Reason for Disposition  MODERATE pain (e.g., interferes with normal activities or awakens from sleep)  Answer Assessment - Initial Assessment Questions 1. LOCATION: Where does it hurt? (e.g., left, right)     L side lower back wraps around the back 2. ONSET: When did the pain start?     Yesterday 3. SEVERITY: How bad is the pain? (e.g., Scale 1-10; mild, moderate, or severe)   - MILD (1-3): doesn't interfere with normal activities    - MODERATE (4-7): interferes with normal activities or awakens from sleep    - SEVERE (8-10): excruciating pain and patient unable to do normal activities (stays in bed)       5/10 4. PATTERN: Does the pain come and go, or is it constant?      Constant 5. CAUSE: What do you think is causing the pain?     Kidney  stones 6. OTHER SYMPTOMS:  Do you have any other symptoms? (e.g., fever, abdomen pain, vomiting, leg weakness, burning with urination, blood in urine)     Denies  Protocols used: Flank Pain-A-AH

## 2023-05-17 NOTE — Progress Notes (Signed)
 Established Patient Office Visit   Subjective:  Patient ID: Jason Waters, male    DOB: [birth year]  Age: 52 y.o. MRN: [id number]  Chief Complaint  Patient presents with   Back Pain    Left lower back pain x 3 days. Pt complains of sore bruising feeling. Feels like someone punched him.   Shoulder Pain    Left shoulder pain x 1 year    Back Pain Pertinent negatives include no abdominal pain, tingling or weakness.  Shoulder Pain  Pertinent negatives include no tingling.   Encounter Diagnoses  Name Primary?   Injury of left rotator cuff, initial encounter Yes   Strain of lumbar region, initial encounter    Ongoing history of slowly worsening left shoulder pain.  Cannot think of any specific injury.  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  He is right-hand dominant.  There is painful range of motion.  It is painful for him to lie on his left side.  There has been no weakness numbness or tingling in his left arm.  Also complains of a 2-day history of left lower back pain.  No injury.  Pain is described as burning.  He has seen no rash.  Pain is nonradiating.  No weakness numbness or tingling in his lower extremities.  No change in bowel or bladder function.  History of microdiscectomy 2017.   Review of Systems  Constitutional: Negative.   HENT: Negative.    Eyes:  Negative for blurred vision, discharge and redness.  Respiratory: Negative.    Cardiovascular: Negative.   Gastrointestinal:  Negative for abdominal pain.  Genitourinary: Negative.   Musculoskeletal:  Positive for back pain and joint pain. Negative for myalgias.  Skin:  Negative for rash.  Neurological:  Negative for tingling, loss of consciousness and weakness.  Endo/Heme/Allergies:  Negative for polydipsia.     Current Outpatient Medications:    aspirin  (ASPIRIN  CHILDRENS) 81 MG chewable tablet, Chew 1 tablet (81 mg total) by mouth daily., Disp: , Rfl:    metFORMIN   (GLUCOPHAGE -XR) 500 MG 24 hr tablet, TAKE 1 TABLET BY MOUTH TWICE A DAY, Disp: 180 tablet, Rfl: 1   olmesartan  (BENICAR ) 20 MG tablet, TAKE 1/2 TABLET BY MOUTH EVERY DAY, Disp: 15 tablet, Rfl: 11   predniSONE  (STERAPRED UNI-PAK 21 TAB) 10 MG (21) TBPK tablet, Take 6 today, 5 tomorrow, 4 the next day and then 3, 2, 1 and stop, Disp: 21 tablet, Rfl: 0   simvastatin  (ZOCOR ) 40 MG tablet, TAKE 1 TABLET BY MOUTH EVERYDAY AT BEDTIME, Disp: 90 tablet, Rfl: 3   tirzepatide  (MOUNJARO ) 15 MG/0.5ML Pen, Inject 15 mg into the skin once a week., Disp: 2 mL, Rfl: 11   traMADol  (ULTRAM ) 50 MG tablet, May take 1 at night as needed., Disp: 15 tablet, Rfl: 0   Objective:     BP 116/76   Pulse 79   Temp 98.2 F (36.8 C)   Ht 5' 10 (1.778 m)   Wt 287 lb 9.6 oz (130.5 kg)   SpO2 95%   BMI 41.27 kg/m  Wt Readings from Last 3 Encounters:  05/17/23 287 lb 9.6 oz (130.5 kg)  05/02/23 289 lb 3.2 oz (131.2 kg)  01/07/23 281 lb 9.6 oz (127.7 kg)      Physical Exam Constitutional:      General: He is not in acute distress.    Appearance: Normal appearance. He is not ill-appearing, toxic-appearing or diaphoretic.  HENT:     Head: Normocephalic and atraumatic.     Right Ear: External ear normal.     Left Ear: External ear normal.  Eyes:     General: No scleral icterus.       Right eye: No discharge.        Left eye: No discharge.     Extraocular Movements: Extraocular movements intact.     Conjunctiva/sclera: Conjunctivae normal.  Pulmonary:     Effort: Pulmonary effort is normal. No respiratory distress.  Musculoskeletal:     Left shoulder: Tenderness present. Normal range of motion.     Lumbar back: No bony tenderness. Normal range of motion. Negative right straight leg raise test and negative left straight leg raise test.     Comments: Full range of motion of left shoulder with difficulty.  Negative drop arm.  Some pain in left lower back with rotation to right.  Skin:    General: Skin is warm  and dry.     Comments: No rash in left lower back or left lower abdominal area.  Neurological:     Mental Status: He is alert and oriented to person, place, and time.     Motor: No weakness.     Deep Tendon Reflexes:     Reflex Scores:      Patellar reflexes are 1+ on the right side and 1+ on the left side.      Achilles reflexes are 1+ on the right side and 1+ on the left side. Psychiatric:        Mood and Affect: Mood normal.        Behavior: Behavior normal.      No results found for any visits on 05/17/23.    The ASCVD Risk score (Arnett DK, et al., 2019) failed to calculate for the following reasons:   The valid total cholesterol range is 130 to 320 mg/dL    Assessment & Plan:   Injury of left rotator cuff, initial encounter -     predniSONE ; Take 6 today, 5 tomorrow, 4 the next day and then 3, 2, 1 and stop  Dispense: 21 tablet; Refill: 0 -     traMADol  HCl; May take 1 at night as needed.  Dispense: 15 tablet; Refill: 0 -     Ambulatory referral to Sports Medicine  Strain of lumbar region, initial encounter -     predniSONE ; Take 6 today, 5 tomorrow, 4 the next day and then 3, 2, 1 and stop  Dispense: 21 tablet; Refill: 0 -     traMADol  HCl; May take 1 at night as needed.  Dispense: 15 tablet; Refill: 0 -     Ambulatory referral to Sports Medicine -     Urinalysis, Routine w reflex microscopic    Return if symptoms worsen or fail to improve.    Elsie Sim Lent, MD

## 2023-05-19 ENCOUNTER — Encounter: Payer: Self-pay | Admitting: Family Medicine

## 2023-05-19 ENCOUNTER — Ambulatory Visit: Payer: 59 | Admitting: Family Medicine

## 2023-05-19 VITALS — BP 128/82 | Ht 70.0 in | Wt 287.0 lb

## 2023-05-19 DIAGNOSIS — M25512 Pain in left shoulder: Secondary | ICD-10-CM

## 2023-05-19 DIAGNOSIS — M7582 Other shoulder lesions, left shoulder: Secondary | ICD-10-CM | POA: Diagnosis not present

## 2023-05-19 DIAGNOSIS — M25412 Effusion, left shoulder: Secondary | ICD-10-CM

## 2023-05-19 DIAGNOSIS — G8929 Other chronic pain: Secondary | ICD-10-CM

## 2023-05-19 MED ORDER — IBUPROFEN 800 MG PO TABS: 800.0000 mg | ORAL_TABLET | Freq: Three times a day (TID) | ORAL | 0 refills | Status: AC | PRN

## 2023-05-19 MED ORDER — TRIAMCINOLONE ACETONIDE 40 MG/ML IJ SUSP
40.0000 mg | Freq: Once | INTRAMUSCULAR | Status: AC
  Administered 2023-05-19: 40 mg via INTRA_ARTICULAR
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
## 2023-06-29 ENCOUNTER — Ambulatory Visit: Payer: 59 | Admitting: Family Medicine

## 2023-07-11 ENCOUNTER — Ambulatory Visit: Payer: 59 | Admitting: Family Medicine

## 2023-07-11 ENCOUNTER — Encounter: Payer: Self-pay | Admitting: Family Medicine

## 2023-07-11 VITALS — BP 128/82 | HR 76 | Temp 97.7°F | Ht 70.0 in | Wt 279.2 lb

## 2023-07-11 DIAGNOSIS — Z23 Encounter for immunization: Secondary | ICD-10-CM

## 2023-07-11 DIAGNOSIS — R7303 Prediabetes: Secondary | ICD-10-CM | POA: Diagnosis not present

## 2023-07-11 DIAGNOSIS — I10 Essential (primary) hypertension: Secondary | ICD-10-CM | POA: Diagnosis not present

## 2023-07-11 LAB — BASIC METABOLIC PANEL
BUN: 13 mg/dL (ref 6–23)
CO2: 24 meq/L (ref 19–32)
Calcium: 9 mg/dL (ref 8.4–10.5)
Chloride: 107 meq/L (ref 96–112)
Creatinine, Ser: 0.83 mg/dL (ref 0.40–1.50)
GFR: 100.57 mL/min (ref >60.00)
Glucose, Bld: 127 mg/dL — ABNORMAL HIGH (ref 70–99)
Potassium: 4.3 meq/L (ref 3.5–5.1)
Sodium: 139 meq/L (ref 135–145)

## 2023-07-11 LAB — HEMOGLOBIN A1C: Hgb A1c MFr Bld: 5.7 % (ref 4.6–6.5)

## 2023-07-11 NOTE — Progress Notes (Signed)
 Established Patient Office Visit   Subjective:  Patient ID: Jason Waters, Jason Waters    DOB: 03-29-1971  Age: 53 y.o. MRN: [id number]  No chief complaint on file.   HPI Encounter Diagnoses  Name Primary?   Pre-diabetes Yes   Immunization due    Essential hypertension    For follow-up of above.  Prediabetes controlled with Mounjaro 15 mg weekly and metformin 500 mg twice daily.  Continues olmesartan 20 for hypertension.  Has been able to lose some weight.  Due for Prevnar 20.  Needs to see eye doctor.  Shoulder is doing better after steroid injection   Review of Systems  Constitutional: Negative.   HENT: Negative.    Eyes:  Negative for blurred vision, discharge and redness.  Respiratory: Negative.    Cardiovascular: Negative.   Gastrointestinal:  Negative for abdominal pain.  Genitourinary: Negative.   Musculoskeletal: Negative.  Negative for myalgias.  Skin:  Negative for rash.  Neurological:  Negative for tingling, loss of consciousness and weakness.  Endo/Heme/Allergies:  Negative for polydipsia.     Current Outpatient Medications:    aspirin (ASPIRIN CHILDRENS) 81 MG chewable tablet, Chew 1 tablet (81 mg total) by mouth daily., Disp: , Rfl:    metFORMIN (GLUCOPHAGE-XR) 500 MG 24 hr tablet, TAKE 1 TABLET BY MOUTH TWICE A DAY, Disp: 180 tablet, Rfl: 1   olmesartan (BENICAR) 20 MG tablet, TAKE 1/2 TABLET BY MOUTH EVERY DAY, Disp: 15 tablet, Rfl: 11   simvastatin (ZOCOR) 40 MG tablet, TAKE 1 TABLET BY MOUTH EVERYDAY AT BEDTIME, Disp: 90 tablet, Rfl: 3   tirzepatide (MOUNJARO) 15 MG/0.5ML Pen, Inject 15 mg into the skin once a week., Disp: 2 mL, Rfl: 11   predniSONE (STERAPRED UNI-PAK 21 TAB) 10 MG (21) TBPK tablet, Take 6 today, 5 tomorrow, 4 the next day and then 3, 2, 1 and stop (Patient not taking: Reported on 07/11/2023), Disp: 21 tablet, Rfl: 0   traMADol (ULTRAM) 50 MG tablet, May take 1 at night as needed. (Patient not taking: Reported on 07/11/2023), Disp: 15 tablet,  Rfl: 0   Objective:     BP 128/82   Pulse 76   Temp 97.7 F (36.5 C)   Ht 5\' 10"  (1.778 m)   Wt 279 lb 3.2 oz (126.6 kg)   SpO2 96%   BMI 40.06 kg/m  BP Readings from Last 3 Encounters:  07/11/23 128/82  05/19/23 128/82  05/17/23 116/76   Wt Readings from Last 3 Encounters:  07/11/23 279 lb 3.2 oz (126.6 kg)  05/19/23 287 lb (130.2 kg)  05/17/23 287 lb 9.6 oz (130.5 kg)      Physical Exam Constitutional:      General: He is not in acute distress.    Appearance: Normal appearance. He is not ill-appearing, toxic-appearing or diaphoretic.  HENT:     Head: Normocephalic and atraumatic.     Right Ear: External ear normal.     Left Ear: External ear normal.  Eyes:     General: No scleral icterus.       Right eye: No discharge.        Left eye: No discharge.     Extraocular Movements: Extraocular movements intact.     Conjunctiva/sclera: Conjunctivae normal.  Pulmonary:     Effort: Pulmonary effort is normal. No respiratory distress.  Skin:    General: Skin is warm and dry.  Neurological:     Mental Status: He is alert and oriented to person, place, and time.  Psychiatric:        Mood and Affect: Mood normal.        Behavior: Behavior normal.      No results found for any visits on 07/11/23.    The ASCVD Risk score (Arnett DK, et al., 2019) failed to calculate for the following reasons:   The valid total cholesterol range is 130 to 320 mg/dL    Assessment & Plan:   Pre-diabetes -     Basic metabolic panel -     Hemoglobin A1c  Immunization due -     Pneumococcal conjugate vaccine 20-valent  Essential hypertension -     Basic metabolic panel    Return in about 6 months (around 01/08/2024) for annual physical, chronic disease follow-up.  Advised eye check.  Continue all medications as above  Mliss Sax, MD

## 2023-07-20 ENCOUNTER — Other Ambulatory Visit: Payer: Self-pay | Admitting: Family Medicine

## 2023-07-20 DIAGNOSIS — E119 Type 2 diabetes mellitus without complications: Secondary | ICD-10-CM

## 2023-07-24 ENCOUNTER — Other Ambulatory Visit: Payer: Self-pay | Admitting: Family Medicine

## 2023-07-24 DIAGNOSIS — E119 Type 2 diabetes mellitus without complications: Secondary | ICD-10-CM

## 2023-08-15 ENCOUNTER — Ambulatory Visit: Admitting: Family Medicine

## 2023-08-15 ENCOUNTER — Ambulatory Visit: Payer: Self-pay

## 2023-08-15 ENCOUNTER — Ambulatory Visit
Admission: RE | Admit: 2023-08-15 | Discharge: 2023-08-15 | Disposition: A | Discharge status: Home / Self Care | Source: Ambulatory Visit | Attending: Family Medicine | Admitting: Family Medicine

## 2023-08-15 ENCOUNTER — Encounter: Payer: Self-pay | Admitting: Family Medicine

## 2023-08-15 VITALS — BP 134/71 | Ht 70.0 in | Wt 275.0 lb

## 2023-08-15 DIAGNOSIS — G8929 Other chronic pain: Secondary | ICD-10-CM

## 2023-08-15 DIAGNOSIS — M19012 Primary osteoarthritis, left shoulder: Secondary | ICD-10-CM

## 2023-08-15 DIAGNOSIS — M25512 Pain in left shoulder: Secondary | ICD-10-CM | POA: Diagnosis not present

## 2023-08-15 MED ORDER — METHYLPREDNISOLONE ACETATE 40 MG/ML IJ SUSP
40.0000 mg | Freq: Once | INTRAMUSCULAR | Status: AC
  Administered 2023-08-15: 40 mg via INTRA_ARTICULAR

## 2023-08-15 NOTE — Progress Notes (Signed)
 DATE OF VISIT: 08/15/2023        [name] DOB: [birth year] MRN: [id number]  CC:  lt shoulder pain  History- Jason Waters is a 53 y.o. 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, now works for the city  Last seen 05/19/23 by Dr Cherre Robins at our [facility] - dx with shoulder pain due to impingement and possible cervical radiculopathy - MSK u/s showing: biceps tenosynovitis, subscap and supraspinatus tendinosis, ACJ geyser sign consistent with ACJ OA/effusion  - underwent subacromial injection - Lt shoulder XR ordered, but not completed - rx Ibuprofen 800mg  tid prn given  Today he reports worsening left shoulder pain Denies any injury or trauma Notes that prior injection helped for approximately 2 to 3 weeks, but was made worse after pulling on a heavy tarp while at work Notes increased pain with any range of motion or lifting items Not sleeping well due to pain Limited improvement with ibuprofen   Past Medical History Past Medical History:  Diagnosis Date   Diabetes mellitus without complication (HCC)    Hyperlipidemia    Hyperplastic rectal polyp    Hypertension    Medical history non-contributory    Neuromuscular disorder (HCC)    left arm goes to sleep frequently    Perirectal fistula     Past Surgical History Past Surgical History:  Procedure Laterality Date   HAND SURGERY Left    LUMBAR LAMINECTOMY/DECOMPRESSION MICRODISCECTOMY Right 07/09/2015   Procedure: RIGHT SIDED LUMBAR 5-SACRUM 1 MICRODISECTOMY;  Surgeon: Estill Bamberg, MD;  Location: [facility];  Service: Orthopedics;  Laterality: Right;  Right sided lumbar 5-sacrum 1 microdisectomy   POLYPECTOMY  02/2019   HPP rectal    RECTAL EXAM UNDER ANESTHESIA N/A 03/15/2019   Procedure: ANORECTAL EXAM UNDER ANESTHESIA, REPAIR OF PERIRECTAL FISTULA, POSSIBLE HEMORROIDECTOMY;  Surgeon: Karie Soda, MD;  Location: [facility];   Service: General;  Laterality: N/A;   WISDOM TOOTH EXTRACTION      Medications Current Outpatient Medications  Medication Sig Dispense Refill   aspirin (ASPIRIN CHILDRENS) 81 MG chewable tablet Chew 1 tablet (81 mg total) by mouth daily.     metFORMIN (GLUCOPHAGE-XR) 500 MG 24 hr tablet TAKE 1 TABLET BY MOUTH TWICE A DAY 180 tablet 2   olmesartan (BENICAR) 20 MG tablet TAKE 1/2 TABLET BY MOUTH DAILY 45 tablet 3   predniSONE (STERAPRED UNI-PAK 21 TAB) 10 MG (21) TBPK tablet Take 6 today, 5 tomorrow, 4 the next day and then 3, 2, 1 and stop (Patient not taking: Reported on 07/11/2023) 21 tablet 0   simvastatin (ZOCOR) 40 MG tablet TAKE 1 TABLET BY MOUTH EVERYDAY AT BEDTIME 90 tablet 3   tirzepatide (MOUNJARO) 15 MG/0.5ML Pen Inject 15 mg into the skin once a week. 2 mL 11   traMADol (ULTRAM) 50 MG tablet May take 1 at night as needed. (Patient not taking: Reported on 07/11/2023) 15 tablet 0   No current facility-administered medications for this visit.    Allergies is allergic to bee venom.  Family History - reviewed per EMR and intake form  Social History   reports current alcohol use.  reports that he has never smoked. He has never used smokeless tobacco.  reports no history of drug use.   EXAM: Vitals: BP 134/71   Ht 5\' 10"  (1.778 m)   Wt 275 lb (124.7 kg)   BMI 39.46 kg/m  General: AOx3,  NAD, pleasant SKIN: no rashes or lesions, skin clean, dry, intact MSK: Shoulder: Left shoulder without any gross deformity.  Near full range of motion with positive painful arc.  Tender to palpation over the [facility] joint, mild tenderness palpation of the bicipital groove and greater tuberosity.  Positive empty can, positive speeds, mildly positive Hawkins and Neer, positive crossover.  Rotator cuff strength 5 -/5 throughout Right shoulder full range of motion pain, weakness, instability Neurovascularly intact distally  Imaging: Left shoulder x-ray 04/11/2017 personally reviewed and interpreted  by me today showing: -Mild degenerative changes at the [facility] joint.  Does have enthesopathic changes along the undersurface of the distal clavicle -No other acute abnormalities Assessment & Plan Chronic left shoulder pain Acute on chronic left shoulder pain, minimal improvement with prior subacromial injection 05/19/2023, ongoing pain isolated mostly around the [facility] joint  Plan: -Previous office visit notes reviewed -Diagnosis and treatment options discussed.  I think he would benefit from trial of ultrasound-guided [facility] joint injection.  He would like to proceed with this today.  Please see procedure note below for further details -Updated left shoulder x-rays ordered to see if there has been any significant progression of his osteoarthritis or prior enthesopathic changes -Heat or ice as needed -Can continue NSAIDs as needed -Follow-up 6 weeks for reevaluation, sooner as needed.  If having ongoing symptoms consider possible MRI to further evaluate Osteoarthritis of left AC (acromioclavicular) joint Acute on chronic left shoulder pain, minimal improvement with prior subacromial injection 05/19/2023, ongoing pain isolated mostly around the [facility] joint  Plan: -Previous office visit notes reviewed -Diagnosis and treatment options discussed.  I think he would benefit from trial of ultrasound-guided [facility] joint injection.  He would like to proceed with this today.  Please see procedure note below for further details -Updated left shoulder x-rays ordered to see if there has been any significant progression of his osteoarthritis or prior enthesopathic changes -Heat or ice as needed -Can continue NSAIDs as needed -Follow-up 6 weeks for reevaluation, sooner as needed.  If having ongoing symptoms consider possible MRI to further evaluate  PROCEDURE: Risks & benefits of LT shoulder u/s guided AC joint injection reviewed. Consent obtained. Time-out completed. Patient prepped and draped in the normal fashion.  Musculoskeletal ultrasound used to identify appropriate anatomy. U/S exam showing degenerative spurring at the [facility] joint, positive geyser sign.   After identifying appropriate anatomy, patient positioned & area cleansed with chlorhexidine. Ethyl chloride spray used to anesthetize the skin. After ensuring adequate anesthesia a solution of 1 mL 1% lidocaine with 1 mL methylprednisolone (Depo-medrol) 40mg /mL injected into the left [facility] joint using a 25-gauge 1.5-inch needle via anterior approach under ultrasound guidance. Needle well-visualized in the joint. Images saved. Patient tolerated procedure well without any complications. Area covered with adhesive bandage. Post-procedure care reviewed. All questions answered. -Patient had immediate improvement in pain with range of motion testing and crossover test following the injection   Patient expressed understanding & agreement with above.  Encounter Diagnoses  Name Primary?   Chronic left shoulder pain Yes   Osteoarthritis of left AC (acromioclavicular) joint     Orders Placed This Encounter  Procedures   [place] LIMITED JOINT SPACE STRUCTURES UP LEFT   DG Shoulder Left    Orders Placed This Encounter  Procedures   [place] LIMITED JOINT SPACE STRUCTURES UP LEFT   DG Shoulder Left

## 2023-08-15 NOTE — Patient Instructions (Signed)

## 2023-08-15 NOTE — Assessment & Plan Note (Signed)
 Acute on chronic left shoulder pain, minimal improvement with prior subacromial injection 05/19/2023, ongoing pain isolated mostly around the [facility] joint  Plan: -Previous office visit notes reviewed -Diagnosis and treatment options discussed.  I think he would benefit from trial of ultrasound-guided [facility] joint injection.  He would like to proceed with this today.  Please see procedure note below for further details -Updated left shoulder x-rays ordered to see if there has been any significant progression of his osteoarthritis or prior enthesopathic changes -Heat or ice as needed -Can continue NSAIDs as needed -Follow-up 6 weeks for reevaluation, sooner as needed.  If having ongoing symptoms consider possible MRI to further evaluate

## 2023-08-16 ENCOUNTER — Encounter: Payer: Self-pay | Admitting: Family Medicine

## 2023-09-25 ENCOUNTER — Other Ambulatory Visit: Payer: Self-pay | Admitting: Family Medicine

## 2023-09-25 DIAGNOSIS — E7800 Pure hypercholesterolemia, unspecified: Secondary | ICD-10-CM

## 2023-09-28 ENCOUNTER — Telehealth: Payer: Self-pay | Admitting: Family Medicine

## 2023-09-28 NOTE — Telephone Encounter (Signed)
 ERROR

## 2023-10-03 ENCOUNTER — Ambulatory Visit: Admitting: Family Medicine

## 2023-11-19 ENCOUNTER — Other Ambulatory Visit: Payer: Self-pay

## 2023-11-19 ENCOUNTER — Emergency Department (HOSPITAL_BASED_OUTPATIENT_CLINIC_OR_DEPARTMENT_OTHER)
Admission: EM | Admit: 2023-11-19 | Discharge: 2023-11-19 | Disposition: A | Discharge status: Home / Self Care | Payer: Worker's Compensation | Attending: Emergency Medicine | Admitting: Emergency Medicine

## 2023-11-19 ENCOUNTER — Encounter (HOSPITAL_BASED_OUTPATIENT_CLINIC_OR_DEPARTMENT_OTHER): Payer: Self-pay | Admitting: Emergency Medicine

## 2023-11-19 ENCOUNTER — Other Ambulatory Visit (HOSPITAL_BASED_OUTPATIENT_CLINIC_OR_DEPARTMENT_OTHER): Payer: Self-pay

## 2023-11-19 DIAGNOSIS — Y990 Civilian activity done for income or pay: Secondary | ICD-10-CM | POA: Diagnosis not present

## 2023-11-19 DIAGNOSIS — W450XXA Nail entering through skin, initial encounter: Secondary | ICD-10-CM | POA: Diagnosis not present

## 2023-11-19 DIAGNOSIS — S91332A Puncture wound without foreign body, left foot, initial encounter: Secondary | ICD-10-CM | POA: Insufficient documentation

## 2023-11-19 DIAGNOSIS — Z7982 Long term (current) use of aspirin: Secondary | ICD-10-CM | POA: Diagnosis not present

## 2023-11-19 MED ORDER — CIPROFLOXACIN HCL 500 MG PO TABS
500.0000 mg | ORAL_TABLET | Freq: Two times a day (BID) | ORAL | 0 refills | Status: AC
Start: 2023-11-19 — End: 2023-11-24
  Filled 2023-11-19: qty 10, 5d supply, fill #0

## 2023-11-19 NOTE — Discharge Instructions (Addendum)
Contact a health care provider if: You received a tetanus or rabies shot and you have swelling, severe pain, redness, or bleeding at the injection site. You have any of these signs of wound infection: Redness, swelling, or pain. Fluid or blood. Warmth. Pus or a bad smell. Your sutures come out. You notice something coming out of your wound, such as wood or glass. Your pain is not controlled with medicine. You develop numbness around your wound. Get help right away if: You develop severe swelling around your wound. You have a red streak going away from your wound. You develop painful skin lumps. The wound is on your hand or foot and you: Cannot properly move a finger or toe. Notice that your fingers or toes look pale or bluish.

## 2023-11-19 NOTE — ED Provider Notes (Signed)
 [place] EMERGENCY DEPARTMENT AT [facility] Provider Note   CSN: [id number] Arrival date & time: 11/19/23  1204     Patient presents with: No chief complaint on file.   Jason Waters is a 53 y.o. male who presents to the er with a cc of puncture wound to the foot.  Patient stepped on a nail and had a puncture wound to the bottom of his left foot.  He was wearing his work boot at the time.  He is up-to-date on his tetanus vaccination.  No significant pain at this time.  His boss removed the nail from his foot prior to arrival   HPI     Prior to Admission medications   Medication Sig Start Date End Date Taking? Authorizing Provider  aspirin  (ASPIRIN  CHILDRENS) 81 MG chewable tablet Chew 1 tablet (81 mg total) by mouth daily. 08/09/22   Ladona Heinz, MD  metFORMIN  (GLUCOPHAGE -XR) 500 MG 24 hr tablet TAKE 1 TABLET BY MOUTH TWICE A DAY 07/21/23   Berneta Elsie Sayre, MD  olmesartan  (BENICAR ) 20 MG tablet TAKE 1/2 TABLET BY MOUTH DAILY 07/25/23   Berneta Elsie Sayre, MD  predniSONE  (STERAPRED UNI-PAK 21 TAB) 10 MG (21) TBPK tablet Take 6 today, 5 tomorrow, 4 the next day and then 3, 2, 1 and stop Patient not taking: Reported on 07/11/2023 05/17/23   Berneta Elsie Sayre, MD  simvastatin  (ZOCOR ) 40 MG tablet TAKE 1 TABLET BY MOUTH EVERYDAY AT BEDTIME 09/26/23   Berneta Elsie Sayre, MD  tirzepatide  (MOUNJARO ) 15 MG/0.5ML Pen Inject 15 mg into the skin once a week. 05/02/23   Lucien Orren LOISE, PA-C  traMADol  (ULTRAM ) 50 MG tablet May take 1 at night as needed. Patient not taking: Reported on 07/11/2023 05/17/23   Berneta Elsie Sayre, MD    Allergies: Bee venom    Review of Systems  Updated Vital Signs BP (!) 120/59 (BP Location: Right Arm)   Pulse 73   Temp 98.4 F (36.9 C) (Oral)   Resp 17   Wt 127 kg   SpO2 98%   BMI 40.18 kg/m   Physical Exam Vitals and nursing note reviewed.  Constitutional:      General: He is not in acute distress.    Appearance: He is  well-developed. He is not diaphoretic.  HENT:     Head: Normocephalic and atraumatic.  Eyes:     General: No scleral icterus.    Conjunctiva/sclera: Conjunctivae normal.  Cardiovascular:     Rate and Rhythm: Normal rate and regular rhythm.     Heart sounds: Normal heart sounds.  Pulmonary:     Effort: Pulmonary effort is normal. No respiratory distress.     Breath sounds: Normal breath sounds.  Abdominal:     Palpations: Abdomen is soft.     Tenderness: There is no abdominal tenderness.  Musculoskeletal:     Cervical back: Normal range of motion and neck supple.     Comments: Tiny puncture wound noted to plantar surface of the left foot at the base of the great toe  Skin:    General: Skin is warm and dry.  Neurological:     Mental Status: He is alert.  Psychiatric:        Behavior: Behavior normal.     (all labs ordered are listed, but only abnormal results are displayed) Labs Reviewed - No data to display  EKG: None  Radiology: No results found.   Procedures   Medications Ordered in the ED -  No data to display                                  Medical Decision Making  Patient with puncture wound through the shoe on the left foot.  He is up-to-date on his tetanus vaccination.  The wound was cleansed here in the emergency department.  Patient is diabetic however given the fact that he stepped on the nail and has puncture site issue we will place him on Cipro  for pseudomonal coverage.  Discussed outpatient follow-up and return precautions.  Appropriate for discharge at this time     Final diagnoses:  Puncture wound of left foot, initial encounter    ED Discharge Orders     None          Arloa Chroman, PA-C 11/19/23 1512    Randol Simmonds, MD 11/20/23 [phone]

## 2023-11-19 NOTE — ED Notes (Signed)
 Put betadine  in a pan with warm water and advised pt to soak his foot per RN Alan

## 2023-11-19 NOTE — ED Notes (Signed)
 Betadine  soak complete. D/C home.

## 2023-11-19 NOTE — ED Triage Notes (Signed)
 Pt stepped on a nail at work, left foot, removed nail, here for evaluation

## 2024-01-09 ENCOUNTER — Ambulatory Visit: Payer: 59 | Admitting: Family Medicine

## 2024-03-12 ENCOUNTER — Telehealth: Payer: Self-pay

## 2024-03-12 NOTE — Telephone Encounter (Signed)
 Pt was due to FU with PCP in 12/2023. [facility] message sent to schedule an appointment.

## 2024-05-26 ENCOUNTER — Other Ambulatory Visit: Payer: Self-pay | Admitting: Family Medicine

## 2024-05-26 DIAGNOSIS — E119 Type 2 diabetes mellitus without complications: Secondary | ICD-10-CM
